# Patient Record
Sex: Male | Born: 1975 | Race: White | Hispanic: No | Marital: Married | Smoking: Current every day smoker
Health system: Southern US, Community
[De-identification: ages and names within clinical notes are randomized; demographics above are authoritative.]

## PROBLEM LIST (undated history)

## (undated) DIAGNOSIS — B1920 Unspecified viral hepatitis C without hepatic coma: Secondary | ICD-10-CM

## (undated) DIAGNOSIS — I10 Essential (primary) hypertension: Secondary | ICD-10-CM

## (undated) DIAGNOSIS — K922 Gastrointestinal hemorrhage, unspecified: Secondary | ICD-10-CM

## (undated) DIAGNOSIS — Z8719 Personal history of other diseases of the digestive system: Secondary | ICD-10-CM

## (undated) DIAGNOSIS — Z87442 Personal history of urinary calculi: Secondary | ICD-10-CM

## (undated) HISTORY — PX: TOOTH EXTRACTION: SUR596

## (undated) HISTORY — DX: Gastrointestinal hemorrhage, unspecified: K92.2

---

## 2006-11-13 HISTORY — PX: HAND SURGERY: SHX662

## 2006-11-24 ENCOUNTER — Emergency Department: Payer: Self-pay | Admitting: Emergency Medicine

## 2006-11-24 ENCOUNTER — Other Ambulatory Visit: Payer: Self-pay

## 2007-02-08 ENCOUNTER — Emergency Department: Payer: Self-pay | Admitting: Unknown Physician Specialty

## 2007-02-21 ENCOUNTER — Ambulatory Visit (HOSPITAL_BASED_OUTPATIENT_CLINIC_OR_DEPARTMENT_OTHER): Admission: RE | Admit: 2007-02-21 | Discharge: 2007-02-21 | Payer: Self-pay | Admitting: Orthopedic Surgery

## 2007-09-11 ENCOUNTER — Emergency Department (HOSPITAL_COMMUNITY): Admission: EM | Admit: 2007-09-11 | Discharge: 2007-09-11 | Payer: Self-pay | Admitting: Emergency Medicine

## 2007-10-08 ENCOUNTER — Emergency Department: Payer: Self-pay | Admitting: Internal Medicine

## 2007-10-08 ENCOUNTER — Other Ambulatory Visit: Payer: Self-pay

## 2007-10-09 ENCOUNTER — Ambulatory Visit: Payer: Self-pay | Admitting: Gastroenterology

## 2007-10-11 ENCOUNTER — Emergency Department: Payer: Self-pay | Admitting: Emergency Medicine

## 2007-11-05 ENCOUNTER — Ambulatory Visit: Payer: Self-pay | Admitting: Gastroenterology

## 2008-01-14 ENCOUNTER — Emergency Department: Payer: Self-pay | Admitting: Emergency Medicine

## 2008-03-09 ENCOUNTER — Emergency Department: Payer: Self-pay | Admitting: Emergency Medicine

## 2008-03-23 ENCOUNTER — Emergency Department: Payer: Self-pay | Admitting: Emergency Medicine

## 2008-06-09 ENCOUNTER — Emergency Department: Payer: Self-pay | Admitting: Emergency Medicine

## 2008-10-22 ENCOUNTER — Emergency Department: Payer: Self-pay | Admitting: Emergency Medicine

## 2008-11-26 ENCOUNTER — Emergency Department: Payer: Self-pay | Admitting: Emergency Medicine

## 2008-12-01 IMAGING — CR DG HAND COMPLETE 3+V*L*
1 series · 3 of 3 positions shown · non-contrast
Comparison: none

REASON FOR EXAM: injury
COMMENTS:   LMP: (Male)

[Series 1: view not recorded · 0.17mm/px · 3 of 3 slices shown]
[im 1/3]
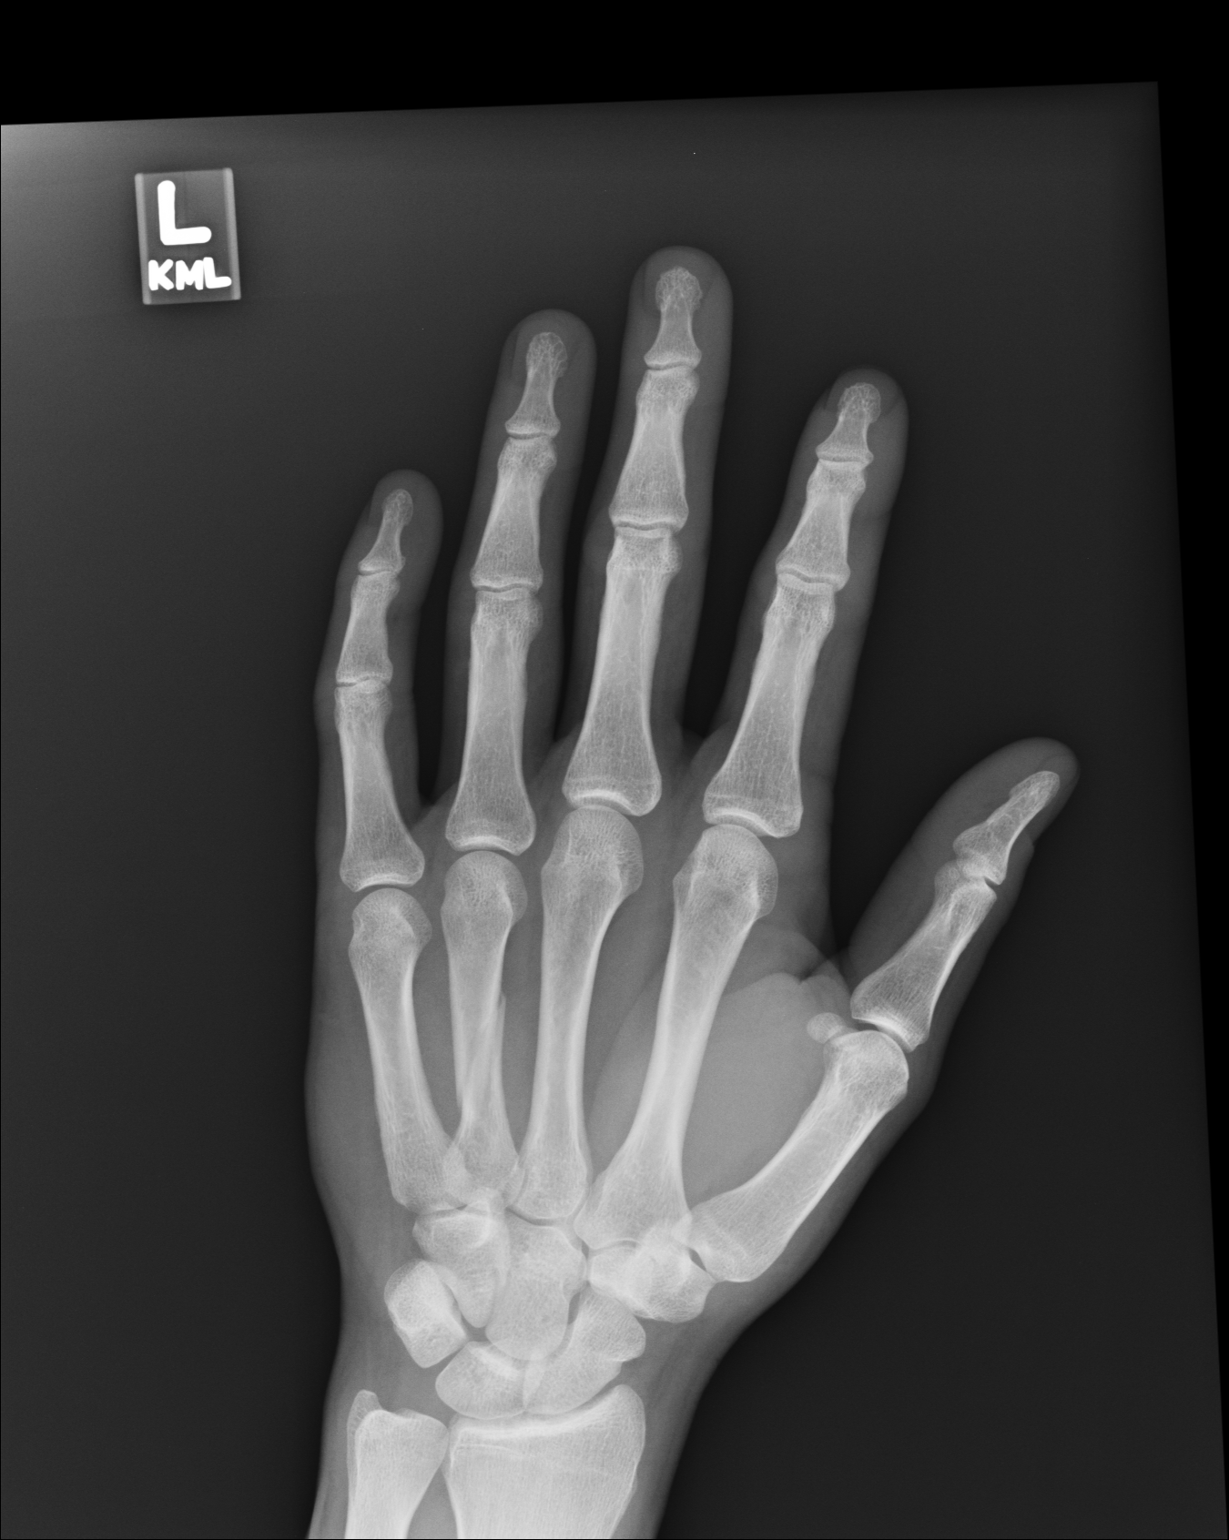
[im 2/3]
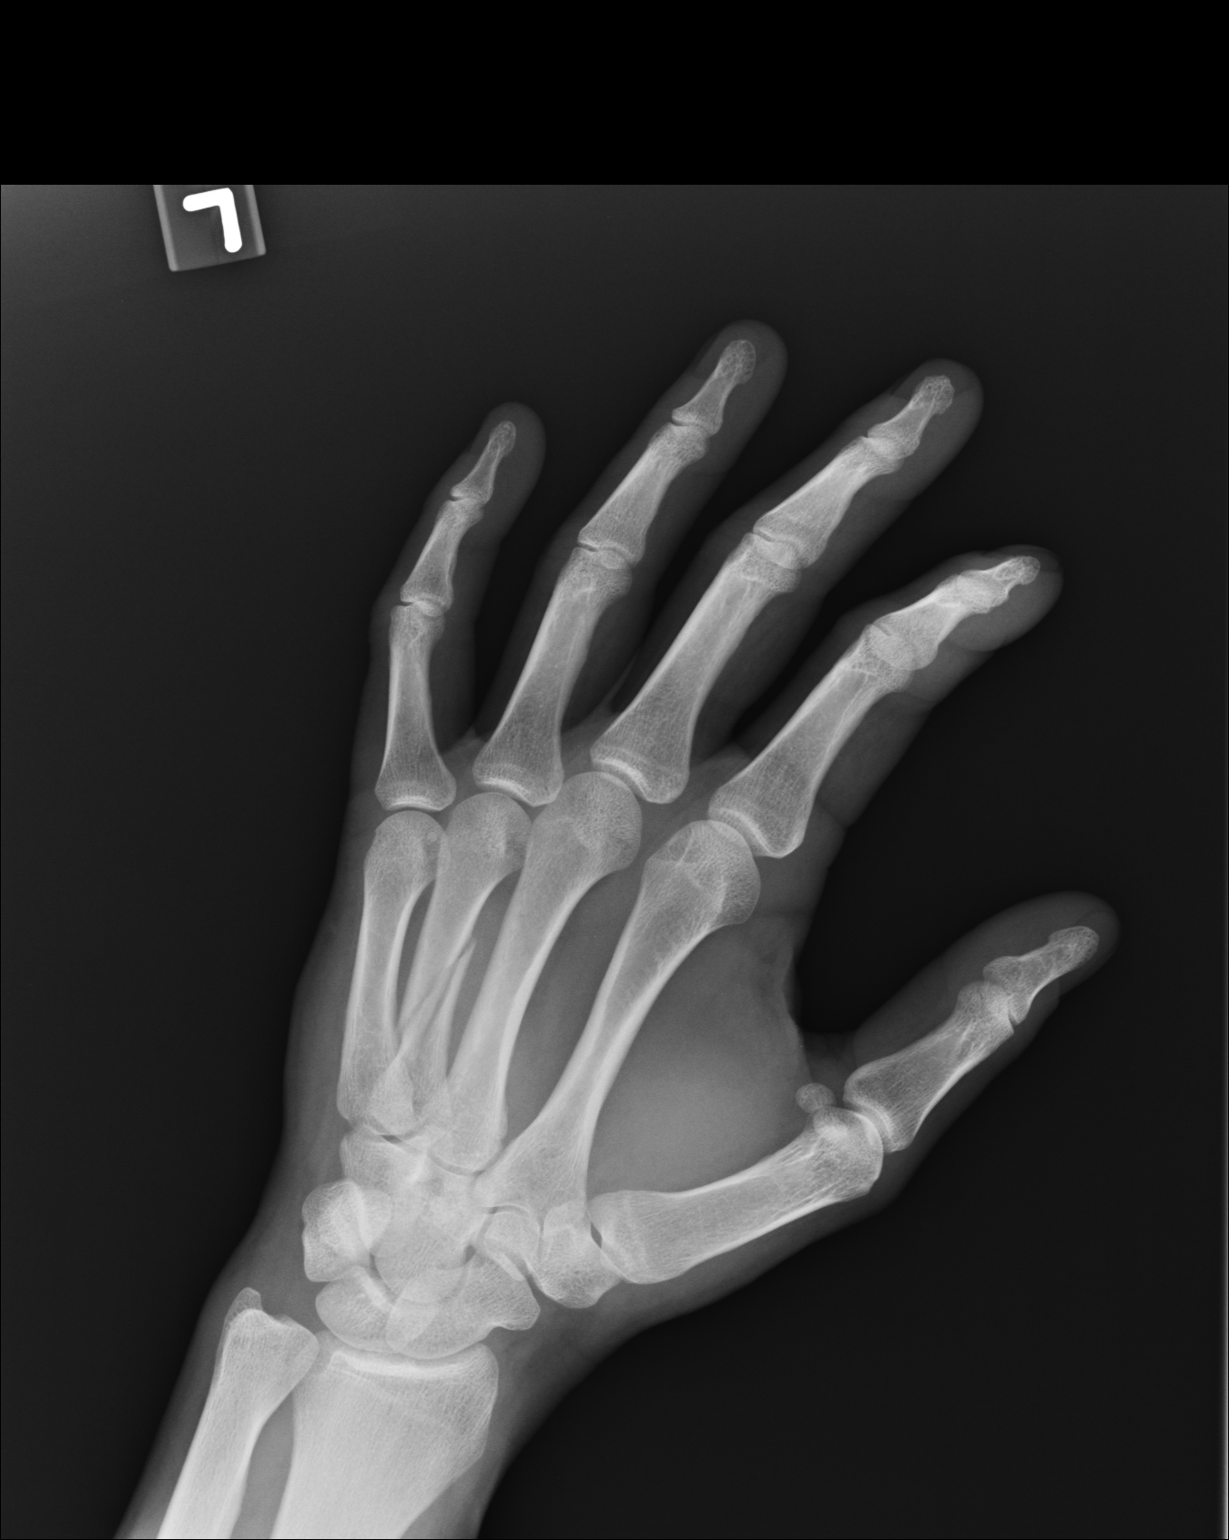
[im 3/3]
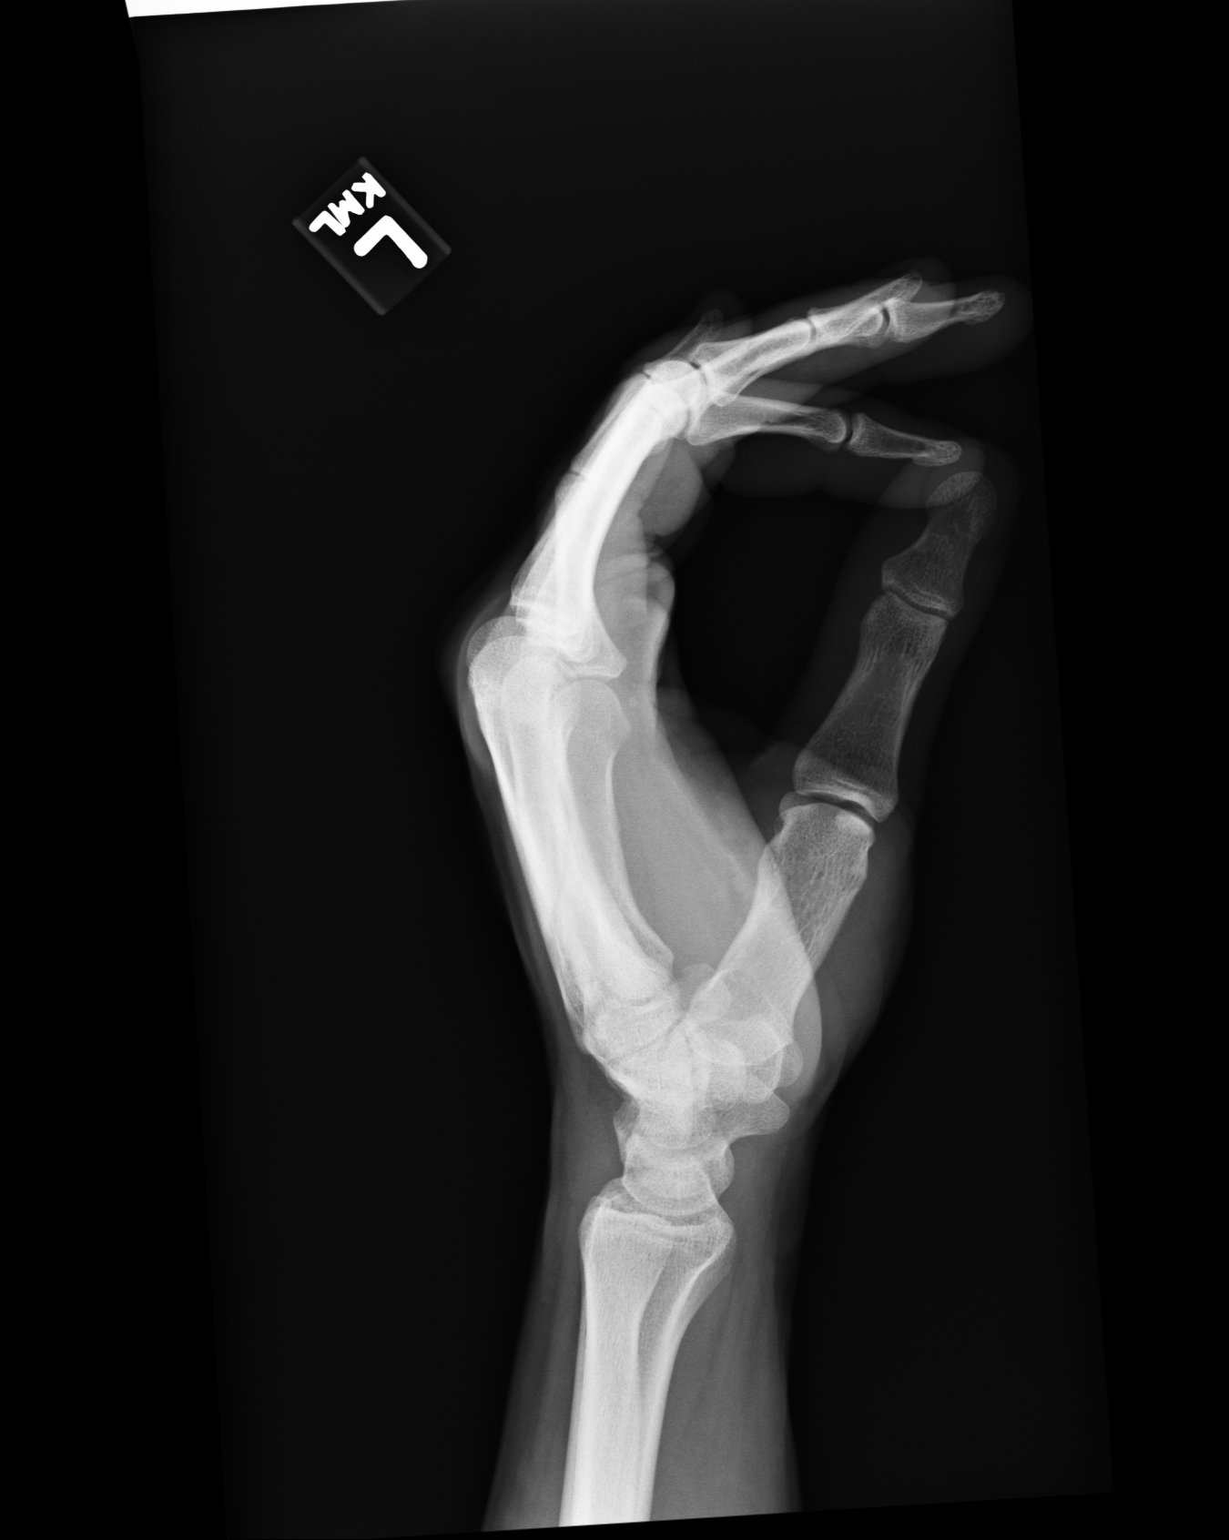

[3 of 3 positions shown; findings below may reference images not displayed]

PROCEDURE:     DXR - DXR HAND LT COMPLETE  W/OBLIQUES  - February 08, 2007 [DATE]

RESULT:     Three views were obtained. There is a minimally displaced
oblique fracture of the proximal diaphysis of the fourth metacarpal. There
is approximately a 2 mm step-off along the lateral cortical margin at the
fracture site distally. No other fractures are seen.
IMPRESSION: 1.     Fracture of the LEFT fourth metacarpal, minimally displaced.

## 2009-03-21 ENCOUNTER — Emergency Department: Payer: Self-pay | Admitting: Unknown Physician Specialty

## 2009-07-17 ENCOUNTER — Emergency Department: Payer: Self-pay | Admitting: Internal Medicine

## 2009-07-21 ENCOUNTER — Emergency Department: Payer: Self-pay | Admitting: Unknown Physician Specialty

## 2009-12-15 ENCOUNTER — Emergency Department: Payer: Self-pay | Admitting: Emergency Medicine

## 2009-12-16 ENCOUNTER — Emergency Department: Payer: Self-pay | Admitting: Emergency Medicine

## 2010-01-29 ENCOUNTER — Emergency Department: Payer: Self-pay | Admitting: Internal Medicine

## 2010-10-15 ENCOUNTER — Emergency Department: Payer: Self-pay | Admitting: Internal Medicine

## 2010-11-25 ENCOUNTER — Emergency Department: Payer: Self-pay | Admitting: Emergency Medicine

## 2011-01-21 ENCOUNTER — Emergency Department: Payer: Self-pay | Admitting: Internal Medicine

## 2011-01-26 ENCOUNTER — Emergency Department: Payer: Self-pay | Admitting: Emergency Medicine

## 2011-02-28 ENCOUNTER — Emergency Department: Payer: Self-pay | Admitting: Emergency Medicine

## 2011-03-12 ENCOUNTER — Emergency Department: Payer: Self-pay | Admitting: Emergency Medicine

## 2011-03-31 NOTE — Op Note (Signed)
NAMEDEKLYN, GIBBON              ACCOUNT NO.:  [id number]   MEDICAL RECORD NO.:  [id number]          PATIENT TYPE:  AMB   LOCATION:  DSC                          FACILITY:  [facility]   PHYSICIAN:  Katy Fitch. Sypher, M.D. DATE OF BIRTH:  [birth year]   DATE OF PROCEDURE:  02/21/2007  DATE OF DISCHARGE:                               OPERATIVE REPORT   PREOPERATIVE DIAGNOSIS:  Displaced spiral oblique fracture of the left  ring finger metacarpal diaphysis with chronic pain and instability and  impending malunion.   POSTOPERATIVE DIAGNOSIS:  Displaced spiral oblique fracture of the left  ring finger metacarpal diaphysis with chronic pain and instability and  impending malunion.   OPERATIONS:  Open reduction internal fixation of left ring finger  metacarpal shaft fracture utilizing hybrid fixation with a five hole  ASIF steel plate and a single lag screw.   OPERATING SURGEON:  Josephine Igo, M.D.   ASSISTANT:  Molly Maduro Dasnoit PA-C.   ANESTHESIA:  General by LMA.   SUPERVISING ANESTHESIOLOGIST:  Dr. Gypsy Balsam.   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  He sustained a  displaced spiral oblique fracture of his left ring finger metacarpal and  was initially seen for evaluation and management at the [facility].  His fracture was noted to be a spiral oblique.  As it was ring finger  metacarpal fracture, they initiated treatment with a short arm cast and  outrigger splint.   Mr. Vincent had persistent pain and was worried about shortening of his  metacarpal and malrotation; therefore, 13 days post injury he sought a  hand surgery consult.   We presented a detailed discussion of the pros and cons of closed  treatment versus open reduction internal fixation.   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  He decided he would prefer to  undergo  open reduction internal fixation with a goal of obtaining an anatomic  reduction, maintaining anatomic reduction and allowing early range of  motion excises so he would not develop stiffness of the metacarpal  phalangeal joints.   After informed consent he is brought to the operating room at this time.   PROCEDURE:  Ewen Varnell is brought to the operating room and placed  supine position on the operating table.   Following the induction of general anesthesia by LMA technique, the left  arm was prepped with Betadine soap solution, sterilely draped.  1 gram  of Ancef was administered as IV prophylactic antibiotic.   The procedure commenced with a 4-cm incision directly over the dorsal  aspect of the left ring finger metacarpal.  Subcutaneous tissue  carefully divided taking care to identify and gently retract the dorsal  ulnar sensory branches.   The fracture site was easily palpable.  The periosteum was thickened and  healing underway in a malunited position with the distal fragment  overriding the proximal fragment by about 6 mm.   All of the callus was taken  down, the periosteum released and the  fracture anatomically reduced.   Due to the fact that this is a relatively short oblique fracture, we  elected to perform hybrid fixation with a five hole plate placed to trap  the distal fragment beneath and a single lag screw was placed at the  proximal apex of the distal fragment.   The central screws in the plate were placed with lag technique.  The  proximal distal screws were placed with neutralization technique and the  lag screw was overdrilled to create excellent compression at the  proximal extent of the distal fragment.   An anatomic reduction was obtained and maintained with the construct.   AP lateral and multiple oblique images were obtained documenting that  the plate and screw construct was appropriately sized and in good  position.   The wound was then  irrigated followed by repair of the periosteum with a  running suture of 4-0 Vicryl.  The skin was repaired with intradermal 3-  0 Prolene and Steri-Strips.   Mr. Canady was placed in a compressive hand dressing in a safe  position.   There were no apparent complications.   He will be discharged prescriptions for Dilaudid 2 mg one to two tablets  by mouth every 4  for 6 hours as needed for pain 20 tablets without  refill.  Also Motrin 6 mg one by mouth every six hours as needed for  pain,  30 tablets without refill and Keflex 500 mg one p.o. every eight  hours x4 days as a prophylactic antibiotic.      Katy Fitch Sypher, M.D.  Electronically Signed     RVS/MEDQ  D:  02/21/2007  T:  02/22/2007  Job:  [id number]

## 2011-08-23 LAB — URINALYSIS, ROUTINE W REFLEX MICROSCOPIC
Bilirubin Urine: NEGATIVE
Glucose, UA: NEGATIVE
Hgb urine dipstick: NEGATIVE
Ketones, ur: NEGATIVE
Nitrite: NEGATIVE
Protein, ur: NEGATIVE
Specific Gravity, Urine: 1.026
Urobilinogen, UA: 1
pH: 6.5

## 2012-02-16 ENCOUNTER — Emergency Department: Payer: Self-pay | Admitting: *Deleted

## 2012-02-27 ENCOUNTER — Emergency Department: Payer: Self-pay | Admitting: Emergency Medicine

## 2012-05-18 ENCOUNTER — Emergency Department: Payer: Self-pay | Admitting: Internal Medicine

## 2013-02-05 ENCOUNTER — Emergency Department: Payer: Self-pay | Admitting: Emergency Medicine

## 2013-02-05 LAB — URINALYSIS, COMPLETE
Bacteria: NONE SEEN
Bilirubin,UR: NEGATIVE
Ketone: NEGATIVE
Leukocyte Esterase: NEGATIVE
RBC,UR: NONE SEEN /HPF (ref 0–5)
Squamous Epithelial: NONE SEEN
WBC UR: <1 /HPF (ref 0–5)

## 2013-02-05 LAB — COMPREHENSIVE METABOLIC PANEL
Alkaline Phosphatase: 62 U/L (ref 50–136)
Bilirubin,Total: 1.1 mg/dL — ABNORMAL HIGH (ref 0.2–1.0)
Calcium, Total: 8.7 mg/dL (ref 8.5–10.1)
Osmolality: 276 (ref 275–301)
SGPT (ALT): 26 U/L (ref 12–78)

## 2013-02-05 LAB — CBC WITH DIFFERENTIAL/PLATELET
HCT: 46.3 % (ref 40.0–52.0)
HGB: 15.8 g/dL (ref 13.0–18.0)
MCH: 32.1 pg (ref 26.0–34.0)
Monocyte #: 0.5 x10 3/mm (ref 0.2–1.0)
Monocyte %: 8 %
Neutrophil %: 64.9 %
Platelet: 286 10*3/uL (ref 150–440)
RBC: 4.93 10*6/uL (ref 4.40–5.90)
RDW: 12.8 % (ref 11.5–14.5)
WBC: 6 10*3/uL (ref 3.8–10.6)

## 2013-02-06 ENCOUNTER — Emergency Department: Payer: Self-pay | Admitting: Internal Medicine

## 2013-02-06 LAB — COMPREHENSIVE METABOLIC PANEL
Albumin: 4.1 g/dL (ref 3.4–5.0)
BUN: 10 mg/dL (ref 7–18)
Co2: 31 mmol/L (ref 21–32)
Creatinine: 1.35 mg/dL — ABNORMAL HIGH (ref 0.60–1.30)
EGFR (African American): >60
EGFR (Non-African Amer.): >60
Glucose: 67 mg/dL (ref 65–99)
Osmolality: 277 (ref 275–301)
Potassium: 4.4 mmol/L (ref 3.5–5.1)
Sodium: 140 mmol/L (ref 136–145)

## 2013-02-06 LAB — LIPASE, BLOOD: Lipase: 83 U/L (ref 73–393)

## 2013-02-06 LAB — CBC
MCH: 32.4 pg (ref 26.0–34.0)
Platelet: 275 10*3/uL (ref 150–440)
RBC: 4.78 10*6/uL (ref 4.40–5.90)
RDW: 12.7 % (ref 11.5–14.5)

## 2013-02-06 LAB — URINALYSIS, COMPLETE
Bilirubin,UR: NEGATIVE
Leukocyte Esterase: NEGATIVE
Ph: 7 (ref 4.5–8.0)
Specific Gravity: 1.009 (ref 1.003–1.030)
Squamous Epithelial: NONE SEEN

## 2014-04-27 ENCOUNTER — Emergency Department: Payer: Self-pay | Admitting: Emergency Medicine

## 2014-04-27 LAB — COMPREHENSIVE METABOLIC PANEL
ALBUMIN: 4.1 g/dL (ref 3.4–5.0)
ALK PHOS: 89 U/L
AST: 25 U/L (ref 15–37)
Anion Gap: 11 (ref 7–16)
BUN: 10 mg/dL (ref 7–18)
Bilirubin,Total: 1 mg/dL (ref 0.2–1.0)
CHLORIDE: 103 mmol/L (ref 98–107)
CO2: 24 mmol/L (ref 21–32)
Calcium, Total: 9.4 mg/dL (ref 8.5–10.1)
Creatinine: 1 mg/dL (ref 0.60–1.30)
EGFR (African American): >60
EGFR (Non-African Amer.): >60
GLUCOSE: 87 mg/dL (ref 65–99)
Osmolality: 274 (ref 275–301)
POTASSIUM: 3.8 mmol/L (ref 3.5–5.1)
SGPT (ALT): 30 U/L (ref 12–78)
Sodium: 138 mmol/L (ref 136–145)
Total Protein: 7.7 g/dL (ref 6.4–8.2)

## 2014-04-27 LAB — URINALYSIS, COMPLETE
BLOOD: NEGATIVE
Bacteria: NONE SEEN
Bilirubin,UR: NEGATIVE
Glucose,UR: NEGATIVE mg/dL (ref 0–75)
Ketone: NEGATIVE
Leukocyte Esterase: NEGATIVE
NITRITE: NEGATIVE
PROTEIN: NEGATIVE
Ph: 8 (ref 4.5–8.0)
RBC,UR: <1 /HPF (ref 0–5)
Specific Gravity: 1.014 (ref 1.003–1.030)
Squamous Epithelial: NONE SEEN

## 2014-04-27 LAB — CBC WITH DIFFERENTIAL/PLATELET
BASOS ABS: 0 10*3/uL (ref 0.0–0.1)
Basophil %: 0.5 %
EOS ABS: 0.1 10*3/uL (ref 0.0–0.7)
EOS PCT: 1.8 %
HCT: 49.1 % (ref 40.0–52.0)
HGB: 16.5 g/dL (ref 13.0–18.0)
LYMPHS ABS: 1.8 10*3/uL (ref 1.0–3.6)
Lymphocyte %: 26.6 %
MCH: 31.2 pg (ref 26.0–34.0)
MCHC: 33.7 g/dL (ref 32.0–36.0)
MCV: 93 fL (ref 80–100)
MONO ABS: 0.6 x10 3/mm (ref 0.2–1.0)
Monocyte %: 8.9 %
Neutrophil #: 4.2 10*3/uL (ref 1.4–6.5)
Neutrophil %: 62.2 %
Platelet: 282 10*3/uL (ref 150–440)
RBC: 5.3 10*6/uL (ref 4.40–5.90)
RDW: 14 % (ref 11.5–14.5)
WBC: 6.7 10*3/uL (ref 3.8–10.6)

## 2014-04-27 LAB — LIPASE, BLOOD: Lipase: 112 U/L (ref 73–393)

## 2014-07-16 ENCOUNTER — Emergency Department: Payer: Self-pay | Admitting: Emergency Medicine

## 2014-07-16 LAB — BASIC METABOLIC PANEL
Anion Gap: 6 — ABNORMAL LOW (ref 7–16)
BUN: 10 mg/dL (ref 7–18)
CALCIUM: 8.8 mg/dL (ref 8.5–10.1)
CHLORIDE: 108 mmol/L — AB (ref 98–107)
CO2: 25 mmol/L (ref 21–32)
Creatinine: 0.99 mg/dL (ref 0.60–1.30)
EGFR (African American): >60
GLUCOSE: 132 mg/dL — AB (ref 65–99)
OSMOLALITY: 278 (ref 275–301)
POTASSIUM: 4.4 mmol/L (ref 3.5–5.1)
Sodium: 139 mmol/L (ref 136–145)

## 2014-07-16 LAB — CBC WITH DIFFERENTIAL/PLATELET
BASOS ABS: 0 10*3/uL (ref 0.0–0.1)
Basophil %: 0.4 %
EOS ABS: 0.1 10*3/uL (ref 0.0–0.7)
EOS PCT: 0.7 %
HCT: 47.4 % (ref 40.0–52.0)
HGB: 15.6 g/dL (ref 13.0–18.0)
LYMPHS ABS: 1.7 10*3/uL (ref 1.0–3.6)
LYMPHS PCT: 21.3 %
MCH: 31.1 pg (ref 26.0–34.0)
MCHC: 32.9 g/dL (ref 32.0–36.0)
MCV: 95 fL (ref 80–100)
MONO ABS: 0.5 x10 3/mm (ref 0.2–1.0)
Monocyte %: 6.5 %
NEUTROS PCT: 71.1 %
Neutrophil #: 5.6 10*3/uL (ref 1.4–6.5)
Platelet: 307 10*3/uL (ref 150–440)
RBC: 5.01 10*6/uL (ref 4.40–5.90)
RDW: 12.8 % (ref 11.5–14.5)
WBC: 7.9 10*3/uL (ref 3.8–10.6)

## 2014-07-16 LAB — URINALYSIS, COMPLETE
BILIRUBIN, UR: NEGATIVE
Bacteria: NONE SEEN
Blood: NEGATIVE
GLUCOSE, UR: NEGATIVE mg/dL (ref 0–75)
Ketone: NEGATIVE
Leukocyte Esterase: NEGATIVE
Nitrite: NEGATIVE
Ph: 7 (ref 4.5–8.0)
Protein: NEGATIVE
RBC,UR: <1 /HPF (ref 0–5)
Specific Gravity: 1.013 (ref 1.003–1.030)

## 2014-07-16 LAB — HEPATIC FUNCTION PANEL A (ARMC)
ALBUMIN: 3.7 g/dL (ref 3.4–5.0)
ALK PHOS: 77 U/L
ALT: 36 U/L
BILIRUBIN TOTAL: 0.6 mg/dL (ref 0.2–1.0)
Bilirubin, Direct: 0.1 mg/dL (ref 0.00–0.20)
SGOT(AST): 30 U/L (ref 15–37)
Total Protein: 7.3 g/dL (ref 6.4–8.2)

## 2014-07-16 LAB — TSH: THYROID STIMULATING HORM: 1.38 u[IU]/mL

## 2014-07-30 ENCOUNTER — Emergency Department: Payer: Self-pay | Admitting: Emergency Medicine

## 2014-07-30 DIAGNOSIS — F1990 Other psychoactive substance use, unspecified, uncomplicated: Secondary | ICD-10-CM | POA: Insufficient documentation

## 2014-08-12 DIAGNOSIS — B1920 Unspecified viral hepatitis C without hepatic coma: Secondary | ICD-10-CM | POA: Insufficient documentation

## 2014-09-27 ENCOUNTER — Emergency Department: Payer: Self-pay | Admitting: Emergency Medicine

## 2014-09-27 LAB — URINALYSIS, COMPLETE
BACTERIA: NONE SEEN
BLOOD: NEGATIVE
Bilirubin,UR: NEGATIVE
Glucose,UR: NEGATIVE mg/dL (ref 0–75)
LEUKOCYTE ESTERASE: NEGATIVE
Nitrite: NEGATIVE
PH: 6 (ref 4.5–8.0)
PROTEIN: NEGATIVE
RBC,UR: <1 /HPF (ref 0–5)
SPECIFIC GRAVITY: 1.016 (ref 1.003–1.030)
Squamous Epithelial: NONE SEEN

## 2014-09-27 LAB — COMPREHENSIVE METABOLIC PANEL
ALT: 292 U/L — AB
AST: 114 U/L — AB (ref 15–37)
Albumin: 3.9 g/dL (ref 3.4–5.0)
Alkaline Phosphatase: 94 U/L
Anion Gap: 5 — ABNORMAL LOW (ref 7–16)
BUN: 11 mg/dL (ref 7–18)
Bilirubin,Total: 2.2 mg/dL — ABNORMAL HIGH (ref 0.2–1.0)
CO2: 26 mmol/L (ref 21–32)
CREATININE: 1.14 mg/dL (ref 0.60–1.30)
Calcium, Total: 8.7 mg/dL (ref 8.5–10.1)
Chloride: 105 mmol/L (ref 98–107)
EGFR (African American): >60
EGFR (Non-African Amer.): >60
Glucose: 143 mg/dL — ABNORMAL HIGH (ref 65–99)
Osmolality: 274 (ref 275–301)
POTASSIUM: 3.9 mmol/L (ref 3.5–5.1)
SODIUM: 136 mmol/L (ref 136–145)
Total Protein: 7.8 g/dL (ref 6.4–8.2)

## 2014-09-27 LAB — CBC WITH DIFFERENTIAL/PLATELET
BASOS PCT: 0.4 %
Basophil #: 0 10*3/uL (ref 0.0–0.1)
EOS PCT: 0.4 %
Eosinophil #: 0 10*3/uL (ref 0.0–0.7)
HCT: 48.1 % (ref 40.0–52.0)
HGB: 16.2 g/dL (ref 13.0–18.0)
LYMPHS ABS: 1.1 10*3/uL (ref 1.0–3.6)
Lymphocyte %: 16.1 %
MCH: 31.7 pg (ref 26.0–34.0)
MCHC: 33.8 g/dL (ref 32.0–36.0)
MCV: 94 fL (ref 80–100)
MONO ABS: 0.8 x10 3/mm (ref 0.2–1.0)
Monocyte %: 11.2 %
NEUTROS ABS: 5 10*3/uL (ref 1.4–6.5)
Neutrophil %: 71.9 %
Platelet: 269 10*3/uL (ref 150–440)
RBC: 5.13 10*6/uL (ref 4.40–5.90)
RDW: 13.8 % (ref 11.5–14.5)
WBC: 7 10*3/uL (ref 3.8–10.6)

## 2014-09-27 LAB — LIPASE, BLOOD: Lipase: 110 U/L (ref 73–393)

## 2014-09-28 DIAGNOSIS — K759 Inflammatory liver disease, unspecified: Secondary | ICD-10-CM | POA: Insufficient documentation

## 2014-11-13 ENCOUNTER — Emergency Department: Payer: Self-pay | Admitting: Student

## 2014-11-13 LAB — CBC WITH DIFFERENTIAL/PLATELET
BASOS PCT: 0.4 %
Basophil #: 0 10*3/uL (ref 0.0–0.1)
EOS PCT: 0.1 %
Eosinophil #: 0 10*3/uL (ref 0.0–0.7)
HCT: 46.6 % (ref 40.0–52.0)
HGB: 15.8 g/dL (ref 13.0–18.0)
Lymphocyte #: 1.1 10*3/uL (ref 1.0–3.6)
Lymphocyte %: 9.5 %
MCH: 31.7 pg (ref 26.0–34.0)
MCHC: 33.8 g/dL (ref 32.0–36.0)
MCV: 94 fL (ref 80–100)
MONO ABS: 0.8 x10 3/mm (ref 0.2–1.0)
MONOS PCT: 7.5 %
NEUTROS PCT: 82.5 %
Neutrophil #: 9.4 10*3/uL — ABNORMAL HIGH (ref 1.4–6.5)
Platelet: 285 10*3/uL (ref 150–440)
RBC: 4.97 10*6/uL (ref 4.40–5.90)
RDW: 13.4 % (ref 11.5–14.5)
WBC: 11.3 10*3/uL — ABNORMAL HIGH (ref 3.8–10.6)

## 2014-11-13 LAB — COMPREHENSIVE METABOLIC PANEL
ALBUMIN: 4 g/dL (ref 3.4–5.0)
ANION GAP: 7 (ref 7–16)
AST: 80 U/L — AB (ref 15–37)
Alkaline Phosphatase: 84 U/L
BUN: 5 mg/dL — ABNORMAL LOW (ref 7–18)
Bilirubin,Total: 1.3 mg/dL — ABNORMAL HIGH (ref 0.2–1.0)
CALCIUM: 8.9 mg/dL (ref 8.5–10.1)
CO2: 26 mmol/L (ref 21–32)
Chloride: 104 mmol/L (ref 98–107)
Creatinine: 1.05 mg/dL (ref 0.60–1.30)
GLUCOSE: 104 mg/dL — AB (ref 65–99)
Osmolality: 271 (ref 275–301)
POTASSIUM: 3.6 mmol/L (ref 3.5–5.1)
SGPT (ALT): 226 U/L — ABNORMAL HIGH
Sodium: 137 mmol/L (ref 136–145)
Total Protein: 7.9 g/dL (ref 6.4–8.2)

## 2014-11-13 LAB — URINALYSIS, COMPLETE
Bacteria: NONE SEEN
Bilirubin,UR: NEGATIVE
Blood: NEGATIVE
Glucose,UR: NEGATIVE mg/dL (ref 0–75)
KETONE: NEGATIVE
Leukocyte Esterase: NEGATIVE
NITRITE: NEGATIVE
PROTEIN: NEGATIVE
Ph: 8 (ref 4.5–8.0)
Specific Gravity: 1.009 (ref 1.003–1.030)
Squamous Epithelial: <1

## 2014-11-13 LAB — DRUG SCREEN, URINE
Amphetamines, Ur Screen: NEGATIVE (ref ?–1000)
BENZODIAZEPINE, UR SCRN: NEGATIVE (ref ?–200)
Barbiturates, Ur Screen: NEGATIVE (ref ?–200)
CANNABINOID 50 NG, UR ~~LOC~~: NEGATIVE (ref ?–50)
Cocaine Metabolite,Ur ~~LOC~~: POSITIVE (ref ?–300)
MDMA (ECSTASY) UR SCREEN: NEGATIVE (ref ?–500)
Methadone, Ur Screen: NEGATIVE (ref ?–300)
Opiate, Ur Screen: NEGATIVE (ref ?–300)
PHENCYCLIDINE (PCP) UR S: NEGATIVE (ref ?–25)
Tricyclic, Ur Screen: NEGATIVE (ref ?–1000)

## 2014-11-13 LAB — ETHANOL: ETHANOL LVL: 5 mg/dL

## 2014-11-13 LAB — SALICYLATE LEVEL: SALICYLATES, SERUM: 2.5 mg/dL

## 2014-11-13 LAB — TROPONIN I: Troponin-I: <0.02 ng/mL

## 2014-11-13 LAB — TSH: THYROID STIMULATING HORM: 2.28 u[IU]/mL

## 2014-11-13 LAB — ACETAMINOPHEN LEVEL: Acetaminophen: <2 ug/mL

## 2014-11-13 LAB — LIPASE, BLOOD: LIPASE: 86 U/L (ref 73–393)

## 2014-11-13 LAB — CK-MB: CK-MB: 0.8 ng/mL (ref 0.5–3.6)

## 2014-11-13 LAB — CK: CK, TOTAL: 98 U/L (ref 39–308)

## 2014-11-16 DIAGNOSIS — F1010 Alcohol abuse, uncomplicated: Secondary | ICD-10-CM | POA: Insufficient documentation

## 2015-07-06 ENCOUNTER — Encounter: Payer: Self-pay | Admitting: Emergency Medicine

## 2015-07-06 ENCOUNTER — Emergency Department
Admission: EM | Admit: 2015-07-06 | Discharge: 2015-07-06 | Disposition: A | Discharge status: Home / Self Care | Payer: Medicaid Other | Attending: Emergency Medicine | Admitting: Emergency Medicine

## 2015-07-06 DIAGNOSIS — Z720 Tobacco use: Secondary | ICD-10-CM | POA: Insufficient documentation

## 2015-07-06 DIAGNOSIS — R109 Unspecified abdominal pain: Secondary | ICD-10-CM | POA: Diagnosis present

## 2015-07-06 DIAGNOSIS — N390 Urinary tract infection, site not specified: Secondary | ICD-10-CM | POA: Insufficient documentation

## 2015-07-06 HISTORY — DX: Unspecified viral hepatitis C without hepatic coma: B19.20

## 2015-07-06 LAB — LIPASE, BLOOD: LIPASE: 22 U/L (ref 22–51)

## 2015-07-06 LAB — COMPREHENSIVE METABOLIC PANEL
ALT: 29 U/L (ref 17–63)
ANION GAP: 7 (ref 5–15)
AST: 34 U/L (ref 15–41)
Albumin: 4.5 g/dL (ref 3.5–5.0)
Alkaline Phosphatase: 61 U/L (ref 38–126)
BUN: 16 mg/dL (ref 6–20)
CHLORIDE: 103 mmol/L (ref 101–111)
CO2: 29 mmol/L (ref 22–32)
Calcium: 9.6 mg/dL (ref 8.9–10.3)
Creatinine, Ser: 1.03 mg/dL (ref 0.61–1.24)
Glucose, Bld: 117 mg/dL — ABNORMAL HIGH (ref 65–99)
POTASSIUM: 4.4 mmol/L (ref 3.5–5.1)
Sodium: 139 mmol/L (ref 135–145)
Total Bilirubin: 1 mg/dL (ref 0.3–1.2)
Total Protein: 8 g/dL (ref 6.5–8.1)

## 2015-07-06 LAB — URINALYSIS COMPLETE WITH MICROSCOPIC (ARMC ONLY)
BACTERIA UA: NONE SEEN
Bilirubin Urine: NEGATIVE
Glucose, UA: NEGATIVE mg/dL
HGB URINE DIPSTICK: NEGATIVE
Ketones, ur: NEGATIVE mg/dL
NITRITE: NEGATIVE
PH: 6 (ref 5.0–8.0)
Protein, ur: NEGATIVE mg/dL
SPECIFIC GRAVITY, URINE: 1.016 (ref 1.005–1.030)

## 2015-07-06 LAB — CBC
HEMATOCRIT: 48.6 % (ref 40.0–52.0)
HEMOGLOBIN: 16.4 g/dL (ref 13.0–18.0)
MCH: 32.1 pg (ref 26.0–34.0)
MCHC: 33.8 g/dL (ref 32.0–36.0)
MCV: 94.9 fL (ref 80.0–100.0)
Platelets: 318 10*3/uL (ref 150–440)
RBC: 5.13 MIL/uL (ref 4.40–5.90)
RDW: 13.1 % (ref 11.5–14.5)
WBC: 6.9 10*3/uL (ref 3.8–10.6)

## 2015-07-06 MED ORDER — SULFAMETHOXAZOLE-TRIMETHOPRIM 800-160 MG PO TABS: 1.0000 | ORAL_TABLET | Freq: Two times a day (BID) | ORAL | Status: DC

## 2015-07-06 NOTE — ED Provider Notes (Signed)
[facility] Emergency Department Provider Note   ____________________________________________  Time seen: 1425  I have reviewed the triage vital signs and the nursing notes.   HISTORY  Chief Complaint Nausea; Diarrhea; and Abdominal Pain   History limited by: Not Limited   HPI Larry Parrish is a 39 y.o. male who presents to the emergency department today because of concerns for abdominal pain, nausea. He states that he has been having these symptoms for the past 3 days. They have been constant. He has not noticed any change with eating. He states that this pain reminds him of the pain he has had when he has had hepatitis flares. He also states he has had some pain with urination. He describes subjective fevers. He denies any chest pain or shortness breath.   Past Medical History  Diagnosis Date  . Hepatitis C     There are no active problems to display for this patient.   History reviewed. No pertinent past surgical history.  No current outpatient prescriptions on file.  Allergies Review of patient's allergies indicates no known allergies.  History reviewed. No pertinent family history.  Social History Social History  Substance Use Topics  . Smoking status: Current Every Day Smoker  . Smokeless tobacco: None  . Alcohol Use: Yes    Review of Systems  Constitutional: Negative for fever. Cardiovascular: Negative for chest pain. Respiratory: Negative for shortness of breath. Gastrointestinal: Positive for upper abdominal pain. Positive for nausea Genitourinary: Positive for dysuria. Musculoskeletal: Negative for back pain. Skin: Negative for rash. Neurological: Negative for headaches, focal weakness or numbness.  10-point ROS otherwise negative.  ____________________________________________   PHYSICAL EXAM:  VITAL SIGNS: ED Triage Vitals  Enc Vitals Group     BP 07/06/15 1220 129/77 mmHg     Pulse Rate 07/06/15 1220 94      Resp 07/06/15 1220 20     Temp 07/06/15 1220 98.2 F (36.8 C)     Temp Source 07/06/15 1220 Oral     SpO2 07/06/15 1220 100 %     Weight 07/06/15 1220 200 lb (90.719 kg)     Height 07/06/15 1220 6\' 2"  (1.88 m)     Head Cir --      Peak Flow --      Pain Score 07/06/15 1221 10   Constitutional: Alert and oriented. Well appearing and in no distress. Eyes: Conjunctivae are normal. PERRL. Normal extraocular movements. ENT   Head: Normocephalic and atraumatic.   Nose: No congestion/rhinnorhea.   Mouth/Throat: Mucous membranes are moist.   Neck: No stridor. Hematological/Lymphatic/Immunilogical: No cervical lymphadenopathy. Cardiovascular: Normal rate, regular rhythm.  No murmurs, rubs, or gallops. Respiratory: Normal respiratory effort without tachypnea nor retractions. Breath sounds are clear and equal bilaterally. No wheezes/rales/rhonchi. Gastrointestinal: Soft. Minimally tender in the epigastric region. No rebound. No guarding.  Genitourinary: Deferred Musculoskeletal: Normal range of motion in all extremities. No joint effusions.  No lower extremity tenderness nor edema. Neurologic:  Normal speech and language. No gross focal neurologic deficits are appreciated. Speech is normal.  Skin:  Skin is warm, dry and intact. No rash noted. Psychiatric: Mood and affect are normal. Speech and behavior are normal. Patient exhibits appropriate insight and judgment.  ____________________________________________    LABS (pertinent positives/negatives)  Labs Reviewed  COMPREHENSIVE METABOLIC PANEL - Abnormal; Notable for the following:    Glucose, Bld 117 (*)    All other components within normal limits  URINALYSIS COMPLETEWITH MICROSCOPIC ([facility] ONLY) - Abnormal; Notable for  the following:    Color, Urine YELLOW (*)    APPearance CLEAR (*)    Leukocytes, UA 1+ (*)    Squamous Epithelial / LPF 0-5 (*)    All other components within normal limits  LIPASE, BLOOD  CBC      ____________________________________________   EKG  None  ____________________________________________    RADIOLOGY  None  ____________________________________________   PROCEDURES  Procedure(s) performed: None  Critical Care performed: No  ____________________________________________   INITIAL IMPRESSION / ASSESSMENT AND PLAN / ED COURSE  Pertinent labs & imaging results that were available during my care of the patient were reviewed by me and considered in my medical decision making (see chart for details).  Patient presented to the emergency department today because of concerns for abdominal pain and nausea. On exam patient did have some minimal epigastric pain. Patient's urine was concerning for urinary tract infection and patient did state that he had some dysuria. At this point will plan on treating for the UTI. Patient states he has medication at home for nausea.  ____________________________________________   FINAL CLINICAL IMPRESSION(S) / ED DIAGNOSES  Final diagnoses:  UTI (lower urinary tract infection)     Nance Pear, MD 07/06/15 [id number]

## 2015-07-06 NOTE — ED Notes (Signed)
States he developed generalized abd pain with constant nausea about 3 days ago.. States he noticed swelling to both lower ext at that time . No swelling noted at present

## 2015-07-06 NOTE — ED Notes (Signed)
Pt to ed with c/o upper abd pain x 2 days and nausea.  Pt also reports one episode of diarrhea last night.  Hx of hepatitis.

## 2015-07-06 NOTE — Discharge Instructions (Signed)
Please seek medical attention for any high fevers, chest pain, shortness of breath, change in behavior, persistent vomiting, bloody stool or any other new or concerning symptoms. ° °Urinary Tract Infection °Urinary tract infections (UTIs) can develop anywhere along your urinary tract. Your urinary tract is your body's drainage system for removing wastes and extra water. Your urinary tract includes two kidneys, two ureters, a bladder, and a urethra. Your kidneys are a pair of bean-shaped organs. Each kidney is about the size of your fist. They are located below your ribs, one on each side of your spine. °CAUSES °Infections are caused by microbes, which are microscopic organisms, including fungi, viruses, and bacteria. These organisms are so small that they can only be seen through a microscope. Bacteria are the microbes that most commonly cause UTIs. °SYMPTOMS  °Symptoms of UTIs may vary by age and gender of the patient and by the location of the infection. Symptoms in young women typically include a frequent and intense urge to urinate and a painful, burning feeling in the bladder or urethra during urination. Older women and men are more likely to be tired, shaky, and weak and have muscle aches and abdominal pain. A fever may mean the infection is in your kidneys. Other symptoms of a kidney infection include pain in your back or sides below the ribs, nausea, and vomiting. °DIAGNOSIS °To diagnose a UTI, your caregiver will ask you about your symptoms. Your caregiver also will ask to provide a urine sample. The urine sample will be tested for bacteria and white blood cells. White blood cells are made by your body to help fight infection. °TREATMENT  °Typically, UTIs can be treated with medication. Because most UTIs are caused by a bacterial infection, they usually can be treated with the use of antibiotics. The choice of antibiotic and length of treatment depend on your symptoms and the type of bacteria causing your  infection. °HOME CARE INSTRUCTIONS °· If you were prescribed antibiotics, take them exactly as your caregiver instructs you. Finish the medication even if you feel better after you have only taken some of the medication. °· Drink enough water and fluids to keep your urine clear or pale yellow. °· Avoid caffeine, tea, and carbonated beverages. They tend to irritate your bladder. °· Empty your bladder often. Avoid holding urine for long periods of time. °· Empty your bladder before and after sexual intercourse. °· After a bowel movement, women should cleanse from front to back. Use each tissue only once. °SEEK MEDICAL CARE IF:  °· You have back pain. °· You develop a fever. °· Your symptoms do not begin to resolve within 3 days. °SEEK IMMEDIATE MEDICAL CARE IF:  °· You have severe back pain or lower abdominal pain. °· You develop chills. °· You have nausea or vomiting. °· You have continued burning or discomfort with urination. °MAKE SURE YOU:  °· Understand these instructions. °· Will watch your condition. °· Will get help right away if you are not doing well or get worse. °Document Released: 08/09/2005 Document Revised: 04/30/2012 Document Reviewed: 12/08/2011 °[employer]® Patient Information ©2015 [employer]. This information is not intended to replace advice given to you by your health care provider. Make sure you discuss any questions you have with your health care provider. ° °

## 2015-07-08 LAB — URINE CULTURE: Culture: NO GROWTH

## 2015-10-03 ENCOUNTER — Emergency Department
Admission: EM | Admit: 2015-10-03 | Discharge: 2015-10-03 | Disposition: A | Discharge status: Home / Self Care | Payer: Medicaid Other | Attending: Emergency Medicine | Admitting: Emergency Medicine

## 2015-10-03 ENCOUNTER — Emergency Department: Payer: Medicaid Other

## 2015-10-03 ENCOUNTER — Encounter: Payer: Self-pay | Admitting: *Deleted

## 2015-10-03 DIAGNOSIS — R1013 Epigastric pain: Secondary | ICD-10-CM | POA: Diagnosis not present

## 2015-10-03 DIAGNOSIS — Z79899 Other long term (current) drug therapy: Secondary | ICD-10-CM | POA: Diagnosis not present

## 2015-10-03 DIAGNOSIS — R1011 Right upper quadrant pain: Secondary | ICD-10-CM | POA: Insufficient documentation

## 2015-10-03 DIAGNOSIS — Z792 Long term (current) use of antibiotics: Secondary | ICD-10-CM | POA: Insufficient documentation

## 2015-10-03 DIAGNOSIS — R1010 Upper abdominal pain, unspecified: Secondary | ICD-10-CM

## 2015-10-03 DIAGNOSIS — R6883 Chills (without fever): Secondary | ICD-10-CM | POA: Diagnosis not present

## 2015-10-03 DIAGNOSIS — F17210 Nicotine dependence, cigarettes, uncomplicated: Secondary | ICD-10-CM | POA: Insufficient documentation

## 2015-10-03 LAB — URINALYSIS COMPLETE WITH MICROSCOPIC (ARMC ONLY)
BACTERIA UA: NONE SEEN
Bilirubin Urine: NEGATIVE
Glucose, UA: NEGATIVE mg/dL
HGB URINE DIPSTICK: NEGATIVE
Ketones, ur: NEGATIVE mg/dL
LEUKOCYTES UA: NEGATIVE
Nitrite: NEGATIVE
PH: 5 (ref 5.0–8.0)
PROTEIN: NEGATIVE mg/dL
RBC / HPF: NONE SEEN RBC/hpf (ref 0–5)
Specific Gravity, Urine: 1.005 (ref 1.005–1.030)

## 2015-10-03 LAB — CBC
HEMATOCRIT: 45.7 % (ref 40.0–52.0)
Hemoglobin: 15.3 g/dL (ref 13.0–18.0)
MCH: 31.4 pg (ref 26.0–34.0)
MCHC: 33.5 g/dL (ref 32.0–36.0)
MCV: 93.8 fL (ref 80.0–100.0)
Platelets: 243 10*3/uL (ref 150–440)
RBC: 4.87 MIL/uL (ref 4.40–5.90)
RDW: 12.5 % (ref 11.5–14.5)
WBC: 8.3 10*3/uL (ref 3.8–10.6)

## 2015-10-03 LAB — COMPREHENSIVE METABOLIC PANEL
ALT: 54 U/L (ref 17–63)
AST: 35 U/L (ref 15–41)
Albumin: 4.1 g/dL (ref 3.5–5.0)
Alkaline Phosphatase: 51 U/L (ref 38–126)
Anion gap: 7 (ref 5–15)
BUN: 16 mg/dL (ref 6–20)
CHLORIDE: 107 mmol/L (ref 101–111)
CO2: 23 mmol/L (ref 22–32)
Calcium: 9.5 mg/dL (ref 8.9–10.3)
Creatinine, Ser: 0.92 mg/dL (ref 0.61–1.24)
Glucose, Bld: 104 mg/dL — ABNORMAL HIGH (ref 65–99)
POTASSIUM: 4.1 mmol/L (ref 3.5–5.1)
SODIUM: 137 mmol/L (ref 135–145)
Total Bilirubin: 0.8 mg/dL (ref 0.3–1.2)
Total Protein: 7.4 g/dL (ref 6.5–8.1)

## 2015-10-03 LAB — LIPASE, BLOOD: LIPASE: 30 U/L (ref 11–51)

## 2015-10-03 LAB — TROPONIN I

## 2015-10-03 MED ORDER — SODIUM CHLORIDE 0.9 % IV BOLUS (SEPSIS)
1000.0000 mL | Freq: Once | INTRAVENOUS | Status: AC
  Administered 2015-10-03: 1000 mL via INTRAVENOUS

## 2015-10-03 MED ORDER — OXYCODONE-ACETAMINOPHEN 5-325 MG PO TABS: 1.0000 | ORAL_TABLET | Freq: Four times a day (QID) | ORAL | Status: DC | PRN

## 2015-10-03 MED ORDER — MORPHINE SULFATE (PF) 4 MG/ML IV SOLN
4.0000 mg | Freq: Once | INTRAVENOUS | Status: AC
  Administered 2015-10-03: 4 mg via INTRAVENOUS
  Filled 2015-10-03: qty 1

## 2015-10-03 MED ORDER — GI COCKTAIL ~~LOC~~
30.0000 mL | Freq: Once | ORAL | Status: AC
  Administered 2015-10-03: 30 mL via ORAL
  Filled 2015-10-03: qty 30

## 2015-10-03 MED ORDER — ONDANSETRON HCL 4 MG/2ML IJ SOLN
4.0000 mg | Freq: Once | INTRAMUSCULAR | Status: AC
  Administered 2015-10-03: 4 mg via INTRAVENOUS
  Filled 2015-10-03: qty 2

## 2015-10-03 NOTE — ED Provider Notes (Addendum)
[facility] Emergency Department Provider Note  Time seen: 6:02 PM  I have reviewed the triage vital signs and the nursing notes.   HISTORY  Chief Complaint Abdominal Pain    HPI ANDY BIBEAULT is a 39 y.o. male with a past medical history of hepatitis who presents the emergency department right upper quadrant pain 4 days. According to the patient for the past 4 days he has experienced dull/aching right upper quadrant pain. He states the pain is much worse shortly after eating. States a history of similar pains in the past which he has been told is due to gastritis. He states he has been taking his Prilosec without relief. Denies any black or bloody stool, nausea, vomiting, diarrhea or constipation. States he takes laxatives at baseline. Denies fever but states he has had several episodes of chills over the past few days. Describes his pain currently as moderate to severe located in the right upper quadrant.     Past Medical History  Diagnosis Date  . Hepatitis C     There are no active problems to display for this patient.   History reviewed. No pertinent past surgical history.  Current Outpatient Rx  Name  Route  Sig  Dispense  Refill  . lansoprazole (PREVACID) 30 MG capsule   Oral   Take 30 mg by mouth daily at 12 noon.         . sulfamethoxazole-trimethoprim (BACTRIM DS,SEPTRA DS) 800-160 MG per tablet   Oral   Take 1 tablet by mouth 2 (two) times daily.   14 tablet   0     Allergies Review of patient's allergies indicates no known allergies.  History reviewed. No pertinent family history.  Social History Social History  Substance Use Topics  . Smoking status: Current Every Day Smoker -- 0.50 packs/day    Types: Cigarettes  . Smokeless tobacco: None  . Alcohol Use: Yes    Review of Systems Constitutional: Negative for fever. Cardiovascular: Negative for chest pain. Respiratory: Negative for shortness of  breath. Gastrointestinal: Positive for right upper quadrant abdominal pain. Worse after eating. Genitourinary: Negative for dysuria. Negative for hematuria. Neurological: Negative for headache 10-point ROS otherwise negative.  ____________________________________________   PHYSICAL EXAM:  VITAL SIGNS: ED Triage Vitals  Enc Vitals Group     BP 10/03/15 1611 144/85 mmHg     Pulse Rate 10/03/15 1611 83     Resp 10/03/15 1611 16     Temp 10/03/15 1611 98.5 F (36.9 C)     Temp Source 10/03/15 1611 Oral     SpO2 10/03/15 1611 97 %     Weight 10/03/15 1611 205 lb (92.987 kg)     Height 10/03/15 1611 6\' 1"  (1.854 m)     Head Cir --      Peak Flow --      Pain Score 10/03/15 1611 10     Pain Loc --      Pain Edu? --      Excl. in [place]? --     Constitutional: Alert and oriented. Well appearing and in no distress. Eyes: Normal exam ENT   Head: Normocephalic and atraumatic.   Mouth/Throat: Mucous membranes are moist. Cardiovascular: Normal rate, regular rhythm. No murmur Respiratory: Normal respiratory effort without tachypnea nor retractions. Breath sounds are clear  Gastrointestinal: Soft, moderate epigastric with mild right upper quadrant tenderness palpation. No rebound or guarding. No CVA tenderness. Musculoskeletal: Nontender with normal range of motion in all extremities.  Neurologic:  Normal speech and language. No gross focal neurologic deficits  Skin:  Skin is warm, dry and intact.  Psychiatric: Mood and affect are normal. Speech and behavior are normal.  ____________________________________________   RADIOLOGY  Ultrasound within normal limits  ____________________________________________    INITIAL IMPRESSION / ASSESSMENT AND PLAN / ED COURSE  Pertinent labs & imaging results that were available during my care of the patient were reviewed by me and considered in my medical decision making (see chart for details).  Patient presents for right upper  quadrant abdominal pain 4 days. We'll check labs, right upper quadrant ultrasound, and treat discomfort. Patient has a history of similar comfort in the past which he has been told is gastritis. We'll also dosage at cocktail.  Ultrasound negative. Labs are largely within normal limits. I discussed with the patient the possibility of gastritis causing his symptoms. We will discharge with a short course of pain medication, patient is to follow-up with GI medicine as well as his primary care doctor this week. Patient agreeable. Discussed my normal abdominal pain return precautions.  ____________________________________________   FINAL CLINICAL IMPRESSION(S) / ED DIAGNOSES  Epigastric pain Right upper quadrant pain   Harvest Dark, MD 10/03/15 2014  Harvest Dark, MD 10/03/15 2018

## 2015-10-03 NOTE — Discharge Instructions (Signed)

## 2015-10-03 NOTE — ED Notes (Addendum)
Pt presents to ED with upper right sided abd pain x 4 days. Pt is Hep C pt that reports having been taking Prevacid and laxatives normally and denies changes in bowel movements. Pt reports eating and pressure makes pain worse. Pt reports increased fatigue and inability to get out of bed. On and off fevers and cold sweats reported. Pt denies NVD. PT does report having a headache 4 days ago and has been increasing Ibuprofen intake over the past 4 days.

## 2015-10-03 NOTE — ED Notes (Signed)

## 2015-10-03 NOTE — ED Notes (Signed)
RUQ pain, intermittent fever. Denies NVD. Pt taking Prilosec and laxatives. Denies urinary sx.

## 2015-10-05 ENCOUNTER — Emergency Department: Payer: Medicaid Other

## 2015-10-05 ENCOUNTER — Encounter: Payer: Self-pay | Admitting: *Deleted

## 2015-10-05 ENCOUNTER — Emergency Department
Admission: EM | Admit: 2015-10-05 | Discharge: 2015-10-05 | Disposition: A | Discharge status: Home / Self Care | Payer: Medicaid Other | Attending: Emergency Medicine | Admitting: Emergency Medicine

## 2015-10-05 DIAGNOSIS — K2971 Gastritis, unspecified, with bleeding: Secondary | ICD-10-CM | POA: Insufficient documentation

## 2015-10-05 DIAGNOSIS — R1013 Epigastric pain: Secondary | ICD-10-CM

## 2015-10-05 DIAGNOSIS — Z792 Long term (current) use of antibiotics: Secondary | ICD-10-CM | POA: Diagnosis not present

## 2015-10-05 DIAGNOSIS — F17210 Nicotine dependence, cigarettes, uncomplicated: Secondary | ICD-10-CM | POA: Insufficient documentation

## 2015-10-05 DIAGNOSIS — R195 Other fecal abnormalities: Secondary | ICD-10-CM

## 2015-10-05 DIAGNOSIS — R000 Tachycardia, unspecified: Secondary | ICD-10-CM | POA: Insufficient documentation

## 2015-10-05 DIAGNOSIS — R509 Fever, unspecified: Secondary | ICD-10-CM | POA: Diagnosis not present

## 2015-10-05 DIAGNOSIS — Z79899 Other long term (current) drug therapy: Secondary | ICD-10-CM | POA: Diagnosis not present

## 2015-10-05 DIAGNOSIS — R1011 Right upper quadrant pain: Secondary | ICD-10-CM | POA: Diagnosis present

## 2015-10-05 DIAGNOSIS — K2970 Gastritis, unspecified, without bleeding: Secondary | ICD-10-CM

## 2015-10-05 HISTORY — DX: Personal history of other diseases of the digestive system: Z87.19

## 2015-10-05 LAB — COMPREHENSIVE METABOLIC PANEL
ALBUMIN: 4.2 g/dL (ref 3.5–5.0)
ALT: 84 U/L — ABNORMAL HIGH (ref 17–63)
ANION GAP: 8 (ref 5–15)
AST: 65 U/L — ABNORMAL HIGH (ref 15–41)
Alkaline Phosphatase: 57 U/L (ref 38–126)
BILIRUBIN TOTAL: 0.7 mg/dL (ref 0.3–1.2)
BUN: 14 mg/dL (ref 6–20)
CO2: 24 mmol/L (ref 22–32)
Calcium: 9.3 mg/dL (ref 8.9–10.3)
Chloride: 102 mmol/L (ref 101–111)
Creatinine, Ser: 1.03 mg/dL (ref 0.61–1.24)
GFR calc Af Amer: >60 mL/min (ref >60)
GFR calc non Af Amer: >60 mL/min (ref >60)
GLUCOSE: 113 mg/dL — AB (ref 65–99)
POTASSIUM: 3.7 mmol/L (ref 3.5–5.1)
SODIUM: 134 mmol/L — AB (ref 135–145)
TOTAL PROTEIN: 7.6 g/dL (ref 6.5–8.1)

## 2015-10-05 LAB — CBC WITH DIFFERENTIAL/PLATELET
BASOS PCT: 1 %
Basophils Absolute: 0 10*3/uL (ref 0–0.1)
EOS ABS: 0 10*3/uL (ref 0–0.7)
Eosinophils Relative: 0 %
HCT: 44.3 % (ref 40.0–52.0)
Hemoglobin: 15.7 g/dL (ref 13.0–18.0)
Lymphocytes Relative: 20 %
Lymphs Abs: 1.2 10*3/uL (ref 1.0–3.6)
MCH: 32.3 pg (ref 26.0–34.0)
MCHC: 35.5 g/dL (ref 32.0–36.0)
MCV: 91.1 fL (ref 80.0–100.0)
MONO ABS: 0.9 10*3/uL (ref 0.2–1.0)
MONOS PCT: 15 %
Neutro Abs: 3.9 10*3/uL (ref 1.4–6.5)
Neutrophils Relative %: 64 %
Platelets: 226 10*3/uL (ref 150–440)
RBC: 4.86 MIL/uL (ref 4.40–5.90)
RDW: 12.4 % (ref 11.5–14.5)
WBC: 6.1 10*3/uL (ref 3.8–10.6)

## 2015-10-05 LAB — PROTIME-INR
INR: 1.01
PROTHROMBIN TIME: 13.5 s (ref 11.4–15.0)

## 2015-10-05 LAB — URINALYSIS COMPLETE WITH MICROSCOPIC (ARMC ONLY)
BILIRUBIN URINE: NEGATIVE
Bacteria, UA: NONE SEEN
GLUCOSE, UA: NEGATIVE mg/dL
Hgb urine dipstick: NEGATIVE
KETONES UR: NEGATIVE mg/dL
LEUKOCYTES UA: NEGATIVE
Nitrite: NEGATIVE
PH: 6 (ref 5.0–8.0)
Protein, ur: NEGATIVE mg/dL
Specific Gravity, Urine: 1.008 (ref 1.005–1.030)

## 2015-10-05 LAB — APTT: aPTT: 35 s (ref 24–36)

## 2015-10-05 LAB — LIPASE, BLOOD: Lipase: 22 U/L (ref 11–51)

## 2015-10-05 MED ORDER — PANTOPRAZOLE SODIUM 40 MG PO TBEC: 40.0000 mg | DELAYED_RELEASE_TABLET | Freq: Two times a day (BID) | ORAL | Status: DC

## 2015-10-05 MED ORDER — ONDANSETRON HCL 4 MG/2ML IJ SOLN
4.0000 mg | INTRAMUSCULAR | Status: AC
  Administered 2015-10-05: 4 mg via INTRAVENOUS
  Filled 2015-10-05: qty 2

## 2015-10-05 MED ORDER — IOHEXOL 350 MG/ML SOLN
100.0000 mL | Freq: Once | INTRAVENOUS | Status: AC | PRN
  Administered 2015-10-05: 100 mL via INTRAVENOUS

## 2015-10-05 MED ORDER — GI COCKTAIL ~~LOC~~
30.0000 mL | Freq: Once | ORAL | Status: AC
  Administered 2015-10-05: 30 mL via ORAL
  Filled 2015-10-05: qty 30

## 2015-10-05 MED ORDER — SODIUM CHLORIDE 0.9 % IV BOLUS (SEPSIS)
1000.0000 mL | INTRAVENOUS | Status: AC
  Administered 2015-10-05: 1000 mL via INTRAVENOUS

## 2015-10-05 MED ORDER — SUCRALFATE 1 GM/10ML PO SUSP: 1.0000 g | Freq: Four times a day (QID) | ORAL | Status: DC

## 2015-10-05 MED ORDER — HYDROMORPHONE HCL 1 MG/ML IJ SOLN
1.0000 mg | INTRAMUSCULAR | Status: AC
  Administered 2015-10-05: 1 mg via INTRAVENOUS
  Filled 2015-10-05: qty 1

## 2015-10-05 MED ORDER — IOHEXOL 240 MG/ML SOLN
25.0000 mL | Freq: Once | INTRAMUSCULAR | Status: AC | PRN
  Administered 2015-10-05: 25 mL via ORAL

## 2015-10-05 NOTE — ED Notes (Signed)
Sent from [facility]. C/o abd pain, stool positive for blood, has Hep c

## 2015-10-05 NOTE — ED Notes (Signed)
Patient transported to CT 

## 2015-10-05 NOTE — ED Provider Notes (Signed)
[facility], [place] Emergency Department Provider Note  ____________________________________________  Time seen: Approximately 7:16 PM  I have reviewed the triage vital signs and the nursing notes.   HISTORY  Chief Complaint Abdominal pain   HPI Larry Parrish is a 39 y.o. male with a history of hepatitis C and drug abuse (cocaine) and regular ETOH usepresents with severe abdominal pain located in the epigastrium and RUQ, accompanied by nausea, one episode of vomiting.  He also reports fever to 102 yesterday which improved after NSAIDs today.  He was seen in the ED two days ago and had an generally reassuring but technically limited RUQ U/S (due to the patient recently eating) and essentially normal labs.  He follow up today as instructed with his PCP, who noted worsening abd pain, +hemoccult stool, and sent him back to the ED.  He describes the pain as severe, persistent and worsening for days, located in the RUQ and epigastrium, worsened by eating and with any movement, alleviated by nothing.  Denies CP/SOB.   Past Medical History  Diagnosis Date  . Hepatitis C   . History of pancreatitis     There are no active problems to display for this patient.   History reviewed. No pertinent past surgical history.  Current Outpatient Rx  Name  Route  Sig  Dispense  Refill  . lansoprazole (PREVACID) 30 MG capsule   Oral   Take 30 mg by mouth daily at 12 noon.         Marland Kitchen oxyCODONE-acetaminophen (ROXICET) 5-325 MG tablet   Oral   Take 1 tablet by mouth every 6 (six) hours as needed.   15 tablet   0   . pantoprazole (PROTONIX) 40 MG tablet   Oral   Take 1 tablet (40 mg total) by mouth 2 (two) times daily.   60 tablet   1   . sucralfate (CARAFATE) 1 GM/10ML suspension   Oral   Take 10 mLs (1 g total) by mouth 4 (four) times daily.   420 mL   1   . sulfamethoxazole-trimethoprim (BACTRIM DS,SEPTRA DS) 800-160 MG per tablet   Oral   Take 1 tablet by mouth 2  (two) times daily.   14 tablet   0     Allergies Review of patient's allergies indicates no known allergies.  History reviewed. No pertinent family history.  Social History Social History  Substance Use Topics  . Smoking status: Current Every Day Smoker -- 0.50 packs/day    Types: Cigarettes  . Smokeless tobacco: None  . Alcohol Use: Yes     Comment: regular (heavy) ETOH consumption    Review of Systems Constitutional: Fever to 102 at home within the last 24 hours Eyes: No visual changes. ENT: No sore throat. Cardiovascular: Denies chest pain. Respiratory: Denies shortness of breath. Gastrointestinal: severe epigastric and RUQ pain w/ N/V.  Blood in stool and reportedly has had black, tarry stools at home Genitourinary: Negative for dysuria. Musculoskeletal: Negative for back pain. Skin: Negative for rash. Neurological: Negative for headaches, focal weakness or numbness.  10-point ROS otherwise negative.  ____________________________________________   PHYSICAL EXAM:  VITAL SIGNS: ED Triage Vitals  Enc Vitals Group     BP 10/05/15 1820 135/94 mmHg     Pulse Rate 10/05/15 1820 101     Resp 10/05/15 1820 24     Temp 10/05/15 1820 98.2 F (36.8 C)     Temp Source 10/05/15 1820 Oral     SpO2 10/05/15 1820  98 %     Weight 10/05/15 1820 197 lb (89.359 kg)     Height 10/05/15 1820 6\' 1"  (1.854 m)     Head Cir --      Peak Flow --      Pain Score 10/05/15 1820 10     Pain Loc --      Pain Edu? --      Excl. in [place]? --     Constitutional: Alert and oriented. Appears to be in severe distress from abd pain Eyes: Conjunctivae are normal. PERRL. EOMI. Head: Atraumatic. Nose: No congestion/rhinnorhea. Mouth/Throat: Mucous membranes are moist.  Oropharynx non-erythematous. Neck: No stridor.   Cardiovascular: Tachycardia, regular rhythm. Grossly normal heart sounds.  Good peripheral circulation. Respiratory: Normal respiratory effort.  No retractions. Lungs  CTAB. Gastrointestinal/Rectal: Severe RUQ and epigastric tenderness.  Firm but not peritoneal.  Normal external rectal exam, no gross blood but strongly heme positive (quality control passed). Musculoskeletal: No lower extremity tenderness nor edema.  No joint effusions. Neurologic:  Normal speech and language. No gross focal neurologic deficits are appreciated.  Skin:  Skin is warm, dry and intact. No rash noted. Psychiatric: Mood and affect are normal. Speech and behavior are normal.  ____________________________________________   LABS (all labs ordered are listed, but only abnormal results are displayed)  Labs Reviewed  COMPREHENSIVE METABOLIC PANEL - Abnormal; Notable for the following:    Sodium 134 (*)    Glucose, Bld 113 (*)    AST 65 (*)    ALT 84 (*)    All other components within normal limits  URINALYSIS COMPLETEWITH MICROSCOPIC ([facility] ONLY) - Abnormal; Notable for the following:    Color, Urine YELLOW (*)    APPearance CLEAR (*)    Squamous Epithelial / LPF 0-5 (*)    All other components within normal limits  CBC WITH DIFFERENTIAL/PLATELET  LIPASE, BLOOD  PROTIME-INR  APTT   ____________________________________________  EKG  Not indicated ____________________________________________  RADIOLOGY   Ct Abdomen Pelvis W Contrast  10/05/2015  CLINICAL DATA:  Right upper quadrant and epigastric abdominal pain. Bloody stools. Hepatitis-C. EXAM: CT ABDOMEN AND PELVIS WITH CONTRAST TECHNIQUE: Multidetector CT imaging of the abdomen and pelvis was performed using the standard protocol following bolus administration of intravenous contrast. CONTRAST:  150mL OMNIPAQUE IOHEXOL 350 MG/ML SOLN COMPARISON:  11/13/2014 FINDINGS: Lower chest: Normal heart size. No pericardial or pleural effusion. Clear lung bases. Hepatobiliary: Liver demonstrates no focal abnormality or biliary dilatation. Patent hepatic and portal veins. Gallbladder is collapsed. No biliary dilatation. Stable  small porta hepatis lymph nodes as before. Pancreas: No mass, inflammatory changes, or other significant abnormality. Spleen: Within normal limits in size and appearance. Adrenals/Urinary Tract: No masses identified. No evidence of hydronephrosis. Stomach/Bowel: Negative for bowel obstruction, significant dilatation, ileus, or free air. Normal appendix. No acute inflammatory process. Vascular/Lymphatic: No adenopathy. Negative for aneurysm. No vascular abnormality. Reproductive: No mass or other significant abnormality. Other: No inguinal abnormality or hernia. Musculoskeletal: No acute osseous finding. Minor degenerative change at L5-S1. IMPRESSION: No acute intra-abdominal or pelvic finding by CT. Normal appendix. No interval change. Electronically Signed   By: Jerilynn Mages.  Shick M.D.   On: 10/05/2015 21:16    ____________________________________________   PROCEDURES  Procedure(s) performed: None  Critical Care performed: No ____________________________________________   INITIAL IMPRESSION / ASSESSMENT AND PLAN / ED COURSE  Pertinent labs & imaging results that were available during my care of the patient were reviewed by me and considered in my medical decision making (see  chart for details).  Suspect pancreatitis - patient admits to drinking alcohol regularly as well as smoking crack.  Must consider gallbladder disease as well, but U/S was reassuring 2 days ago, though technically limited.  Will control pain/nausea and reassess after labs.  ----------------------------------------- 8:11 PM on 10/05/2015 -----------------------------------------  Lipase is normal and LFTs are unremarkable except for a very slight elevation of AST and ALT.  Cholangitis is very unlikely given that he does not have a fever currently and he has no hyperbilirubinemia/jaundice.  However, his ultrasound was borderline 2 days ago even though it was technically limited.  Given the possibility of various pathology in his  abdomen, I will proceed with a CT scan with by mouth and IV contrast for the best possible evaluation of his abdomen.  No anemia and generally reassuring CBC.  ----------------------------------------- 9:42 PM on 10/05/2015 -----------------------------------------  Patient's workup has been extremely reassuring.  I gave him the good results from the CT scan, he stayed in a good mood, smile is somewhat good to know, and started taking off his monitoring equipment.  I stressed to him the importance of following up with Dr. Vira Agar as recommended and as are scheduled.  He has been hemodynamically stable with no evidence of acute abnormality today.  He has no evidence of peptic ulcer disease on CT scan.  I am recommending to him that he take twice a day PPI and sucralfate as needed.  He understands and agrees with the plan.  I also counseled them on alcohol cessation and avoiding fatty and greasy foods.I gave my usual and customary return precautions.    ____________________________________________  FINAL CLINICAL IMPRESSION(S) / ED DIAGNOSES  Final diagnoses:  RUQ pain  Epigastric pain  Occult GI bleeding  Gastritis      NEW MEDICATIONS STARTED DURING THIS VISIT:  New Prescriptions   PANTOPRAZOLE (PROTONIX) 40 MG TABLET    Take 1 tablet (40 mg total) by mouth 2 (two) times daily.   SUCRALFATE (CARAFATE) 1 GM/10ML SUSPENSION    Take 10 mLs (1 g total) by mouth 4 (four) times daily.     Hinda Kehr, MD 10/05/15 2144

## 2015-10-05 NOTE — Discharge Instructions (Signed)
You have been seen in the Emergency Department (ED) for abdominal pain.  Your evaluation did not identify a clear cause of your symptoms but was generally reassuring.  We believe you are likely suffering from pain related to acid reflux or gastritis given your chronic alcohol use.  It is important that you do not drink alcohol, both for the pancreatitis you have had in the past as well as for the current pain you are experiencing.  We recommend that you increase the dose of your antacid medicines twice a day or try the prescribed pantoprazole twice daily.  You can also use the sucralfate prescription for soothing including your stomach.  Please follow up as instructed above regarding todays emergent visit and the symptoms that are bothering you.  Return to the ED if your abdominal pain worsens or fails to improve, you develop bloody vomiting, bloody diarrhea, you are unable to tolerate fluids due to vomiting, fever greater than 101, or other symptoms that concern you.   Abdominal Pain, Adult Many things can cause abdominal pain. Usually, abdominal pain is not caused by a disease and will improve without treatment. It can often be observed and treated at home. Your health care provider will do a physical exam and possibly order blood tests and X-rays to help determine the seriousness of your pain. However, in many cases, more time must pass before a clear cause of the pain can be found. Before that point, your health care provider may not know if you need more testing or further treatment. HOME CARE INSTRUCTIONS Monitor your abdominal pain for any changes. The following actions may help to alleviate any discomfort you are experiencing:  Only take over-the-counter or prescription medicines as directed by your health care provider.  Do not take laxatives unless directed to do so by your health care provider.  Try a clear liquid diet (broth, tea, or water) as directed by your health care provider.  Slowly move to a bland diet as tolerated. SEEK MEDICAL CARE IF:  You have unexplained abdominal pain.  You have abdominal pain associated with nausea or diarrhea.  You have pain when you urinate or have a bowel movement.  You experience abdominal pain that wakes you in the night.  You have abdominal pain that is worsened or improved by eating food.  You have abdominal pain that is worsened with eating fatty foods.  You have a fever. SEEK IMMEDIATE MEDICAL CARE IF:  Your pain does not go away within 2 hours.  You keep throwing up (vomiting).  Your pain is felt only in portions of the abdomen, such as the right side or the left lower portion of the abdomen.  You pass bloody or black tarry stools. MAKE SURE YOU:  Understand these instructions.  Will watch your condition.  Will get help right away if you are not doing well or get worse.   This information is not intended to replace advice given to you by your health care provider. Make sure you discuss any questions you have with your health care provider.   Document Released: 08/09/2005 Document Revised: 07/21/2015 Document Reviewed: 07/09/2013 Elsevier Interactive Patient Education 2016 Elsevier Inc[employer]  Gastritis, Adult Gastritis is soreness and swelling (inflammation) of the lining of the stomach. Gastritis can develop as a sudden onset (acute) or long-term (chronic) condition. If gastritis is not treated, it can lead to stomach bleeding and ulcers. CAUSES  Gastritis occurs when the stomach lining is weak or damaged. Digestive juices from the stomach  then inflame the weakened stomach lining. The stomach lining may be weak or damaged due to viral or bacterial infections. One common bacterial infection is the Helicobacter pylori infection. Gastritis can also result from excessive alcohol consumption, taking certain medicines, or having too much acid in the stomach.  SYMPTOMS  In some cases, there are no symptoms. When symptoms  are present, they may include:  Pain or a burning sensation in the upper abdomen.  Nausea.  Vomiting.  An uncomfortable feeling of fullness after eating. DIAGNOSIS  Your caregiver may suspect you have gastritis based on your symptoms and a physical exam. To determine the cause of your gastritis, your caregiver may perform the following:  Blood or stool tests to check for the H pylori bacterium.  Gastroscopy. A thin, flexible tube (endoscope) is passed down the esophagus and into the stomach. The endoscope has a light and camera on the end. Your caregiver uses the endoscope to view the inside of the stomach.  Taking a tissue sample (biopsy) from the stomach to examine under a microscope. TREATMENT  Depending on the cause of your gastritis, medicines may be prescribed. If you have a bacterial infection, such as an H pylori infection, antibiotics may be given. If your gastritis is caused by too much acid in the stomach, H2 blockers or antacids may be given. Your caregiver may recommend that you stop taking aspirin, ibuprofen, or other nonsteroidal anti-inflammatory drugs (NSAIDs). HOME CARE INSTRUCTIONS  Only take over-the-counter or prescription medicines as directed by your caregiver.  If you were given antibiotic medicines, take them as directed. Finish them even if you start to feel better.  Drink enough fluids to keep your urine clear or pale yellow.  Avoid foods and drinks that make your symptoms worse, such as:  Caffeine or alcoholic drinks.  Chocolate.  Peppermint or mint flavorings.  Garlic and onions.  Spicy foods.  Citrus fruits, such as oranges, lemons, or limes.  Tomato-based foods such as sauce, chili, salsa, and pizza.  Fried and fatty foods.  Eat small, frequent meals instead of large meals. SEEK IMMEDIATE MEDICAL CARE IF:   You have black or dark red stools.  You vomit blood or material that looks like coffee grounds.  You are unable to keep fluids  down.  Your abdominal pain gets worse.  You have a fever.  You do not feel better after 1 week.  You have any other questions or concerns. MAKE SURE YOU:  Understand these instructions.  Will watch your condition.  Will get help right away if you are not doing well or get worse.   This information is not intended to replace advice given to you by your health care provider. Make sure you discuss any questions you have with your health care provider.   Document Released: 10/24/2001 Document Revised: 04/30/2012 Document Reviewed: 12/13/2011 Elsevier Interactive Patient Education [employer].

## 2015-10-08 ENCOUNTER — Encounter: Payer: Self-pay | Admitting: Emergency Medicine

## 2015-10-08 ENCOUNTER — Emergency Department
Admission: EM | Admit: 2015-10-08 | Discharge: 2015-10-08 | Disposition: A | Discharge status: Home / Self Care | Payer: Medicaid Other | Attending: Emergency Medicine | Admitting: Emergency Medicine

## 2015-10-08 DIAGNOSIS — Z79899 Other long term (current) drug therapy: Secondary | ICD-10-CM | POA: Diagnosis not present

## 2015-10-08 DIAGNOSIS — F1910 Other psychoactive substance abuse, uncomplicated: Secondary | ICD-10-CM

## 2015-10-08 DIAGNOSIS — F1410 Cocaine abuse, uncomplicated: Secondary | ICD-10-CM | POA: Insufficient documentation

## 2015-10-08 DIAGNOSIS — B182 Chronic viral hepatitis C: Secondary | ICD-10-CM

## 2015-10-08 DIAGNOSIS — L02414 Cutaneous abscess of left upper limb: Secondary | ICD-10-CM | POA: Diagnosis present

## 2015-10-08 DIAGNOSIS — F17210 Nicotine dependence, cigarettes, uncomplicated: Secondary | ICD-10-CM | POA: Insufficient documentation

## 2015-10-08 HISTORY — PX: I&D EXTREMITY: SHX5045

## 2015-10-08 LAB — CBC WITH DIFFERENTIAL/PLATELET
BASOS ABS: 0.1 10*3/uL (ref 0–0.1)
Basophils Relative: 1 %
EOS PCT: 2 %
Eosinophils Absolute: 0.1 10*3/uL (ref 0–0.7)
HCT: 42.3 % (ref 40.0–52.0)
Hemoglobin: 14.5 g/dL (ref 13.0–18.0)
LYMPHS PCT: 20 %
Lymphs Abs: 1.5 10*3/uL (ref 1.0–3.6)
MCH: 32 pg (ref 26.0–34.0)
MCHC: 34.4 g/dL (ref 32.0–36.0)
MCV: 93.1 fL (ref 80.0–100.0)
MONO ABS: 1 10*3/uL (ref 0.2–1.0)
MONOS PCT: 14 %
Neutro Abs: 4.8 10*3/uL (ref 1.4–6.5)
Neutrophils Relative %: 63 %
PLATELETS: 211 10*3/uL (ref 150–440)
RBC: 4.54 MIL/uL (ref 4.40–5.90)
RDW: 12.3 % (ref 11.5–14.5)
WBC: 7.5 10*3/uL (ref 3.8–10.6)

## 2015-10-08 LAB — COMPREHENSIVE METABOLIC PANEL
ALT: 40 U/L (ref 17–63)
AST: 28 U/L (ref 15–41)
Albumin: 3.9 g/dL (ref 3.5–5.0)
Alkaline Phosphatase: 60 U/L (ref 38–126)
Anion gap: 7 (ref 5–15)
BILIRUBIN TOTAL: 0.5 mg/dL (ref 0.3–1.2)
BUN: 15 mg/dL (ref 6–20)
CHLORIDE: 105 mmol/L (ref 101–111)
CO2: 26 mmol/L (ref 22–32)
CREATININE: 0.99 mg/dL (ref 0.61–1.24)
Calcium: 9 mg/dL (ref 8.9–10.3)
GFR calc Af Amer: >60 mL/min (ref >60)
Glucose, Bld: 98 mg/dL (ref 65–99)
Potassium: 4 mmol/L (ref 3.5–5.1)
Sodium: 138 mmol/L (ref 135–145)
TOTAL PROTEIN: 7.4 g/dL (ref 6.5–8.1)

## 2015-10-08 LAB — LACTIC ACID, PLASMA: LACTIC ACID, VENOUS: 1.1 mmol/L (ref 0.5–2.0)

## 2015-10-08 MED ORDER — LIDOCAINE-EPINEPHRINE (PF) 1 %-1:200000 IJ SOLN
INTRAMUSCULAR | Status: AC
  Administered 2015-10-08: 19:00:00
  Filled 2015-10-08: qty 30

## 2015-10-08 MED ORDER — MORPHINE SULFATE (PF) 4 MG/ML IV SOLN
4.0000 mg | Freq: Once | INTRAVENOUS | Status: AC
  Administered 2015-10-08: 4 mg via INTRAVENOUS
  Filled 2015-10-08: qty 1

## 2015-10-08 MED ORDER — SULFAMETHOXAZOLE-TRIMETHOPRIM 800-160 MG PO TABS: 2.0000 | ORAL_TABLET | Freq: Two times a day (BID) | ORAL | Status: DC

## 2015-10-08 MED ORDER — TRAMADOL HCL 50 MG PO TABS: 50.0000 mg | ORAL_TABLET | Freq: Four times a day (QID) | ORAL | Status: DC | PRN

## 2015-10-08 MED ORDER — VANCOMYCIN HCL IN DEXTROSE 1-5 GM/200ML-% IV SOLN
1000.0000 mg | Freq: Once | INTRAVENOUS | Status: AC
  Administered 2015-10-08: 1000 mg via INTRAVENOUS
  Filled 2015-10-08: qty 200

## 2015-10-08 MED ORDER — CEPHALEXIN 500 MG PO CAPS: 500.0000 mg | ORAL_CAPSULE | Freq: Four times a day (QID) | ORAL | Status: DC

## 2015-10-08 NOTE — ED Provider Notes (Signed)
[facility] Emergency Department Provider Note REMINDER - THIS NOTE IS NOT A FINAL MEDICAL RECORD UNTIL IT IS SIGNED. UNTIL THEN, THE CONTENT BELOW MAY REFLECT INFORMATION FROM A DOCUMENTATION TEMPLATE, NOT THE ACTUAL PATIENT VISIT. ____________________________________________  Time seen: Approximately 5:55 PM  I have reviewed the triage vital signs and the nursing notes.   HISTORY  Chief Complaint Abscess    HPI Larry Parrish is a 39 y.o. male the history of IV drug abuse, also a recent GI bleed which she reports is improved. Also history of hepatitis.  Patient reports the last 3-4 days she's had pain over the arm with increasing redness and swelling associated with chills and feeling feverish. He is currently not on any antibiotics, but reports severe pain and swelling around his left mid arm. He does report that he was shooting cocaine about a week ago, twice and he's never ever done this before in his life.  He denies using any other illicit drugs. No numbness or tingling in the left arm. No chest pain, but the pain does radiate from the left arm up towards the left shoulder. He has redness and swelling noted.   Past Medical History  Diagnosis Date  . Hepatitis C   . History of pancreatitis     Patient Active Problem List   Diagnosis Date Noted  . Abscess of left arm   . Chronic hepatitis C without hepatic coma ([place])   . Drug abuse     History reviewed. No pertinent past surgical history.  Current Outpatient Rx  Name  Route  Sig  Dispense  Refill  . cephALEXin (KEFLEX) 500 MG capsule   Oral   Take 1 capsule (500 mg total) by mouth 4 (four) times daily.   40 capsule   0   . lansoprazole (PREVACID) 30 MG capsule   Oral   Take 30 mg by mouth daily at 12 noon.         Marland Kitchen oxyCODONE-acetaminophen (ROXICET) 5-325 MG tablet   Oral   Take 1 tablet by mouth every 6 (six) hours as needed.   15 tablet   0   . pantoprazole (PROTONIX) 40 MG  tablet   Oral   Take 1 tablet (40 mg total) by mouth 2 (two) times daily.   60 tablet   1   . sucralfate (CARAFATE) 1 GM/10ML suspension   Oral   Take 10 mLs (1 g total) by mouth 4 (four) times daily.   420 mL   1   . sulfamethoxazole-trimethoprim (BACTRIM DS,SEPTRA DS) 800-160 MG tablet   Oral   Take 2 tablets by mouth 2 (two) times daily.   40 tablet   0   . traMADol (ULTRAM) 50 MG tablet   Oral   Take 1 tablet (50 mg total) by mouth every 6 (six) hours as needed.   10 tablet   0     Allergies Review of patient's allergies indicates no known allergies.  No family history on file.  Social History Social History  Substance Use Topics  . Smoking status: Current Every Day Smoker -- 0.50 packs/day    Types: Cigarettes  . Smokeless tobacco: None  . Alcohol Use: No     Comment: regular (heavy) ETOH consumption-denies alcohol use x1 week on 10/08/15    Review of Systems Constitutional: As objective fevers and chills Eyes: No visual changes. ENT: No sore throat. Cardiovascular: Denies chest pain. Respiratory: Denies shortness of breath. Gastrointestinal: No abdominal pain.  No nausea, no vomiting.  No diarrhea.  No constipation. Genitourinary: Negative for dysuria. Musculoskeletal: Negative for back pain. Skin: Negative for rash except is noted about the left arm. Neurological: Negative for headaches, focal weakness or numbness.  10-point ROS otherwise negative.  ____________________________________________   PHYSICAL EXAM:  VITAL SIGNS: ED Triage Vitals  Enc Vitals Group     BP 10/08/15 1635 148/103 mmHg     Pulse Rate 10/08/15 1635 93     Resp 10/08/15 1635 16     Temp 10/08/15 1635 98.6 F (37 C)     Temp Source 10/08/15 1635 Oral     SpO2 10/08/15 1635 97 %     Weight 10/08/15 1635 200 lb (90.719 kg)     Height 10/08/15 1635 6\' 1"  (1.854 m)     Head Cir --      Peak Flow --      Pain Score 10/08/15 1635 10     Pain Loc --      Pain Edu? --       Excl. in [place]? --    Constitutional: Alert and oriented. Well appearing and in no acute distress. Eyes: Conjunctivae are normal. PERRL. EOMI. Head: Atraumatic. Nose: No congestion/rhinnorhea. Mouth/Throat: Mucous membranes are moist.  Oropharynx non-erythematous. Neck: No stridor.   Cardiovascular: Normal rate, regular rhythm. Grossly normal heart sounds.  Good peripheral circulation. Respiratory: Normal respiratory effort.  No retractions. Lungs CTAB. Gastrointestinal: Soft and nontender. No distention. No abdominal bruits. No CVA tenderness. Musculoskeletal: No lower extremity tenderness nor edema.  No joint effusions. Right upper extremity normal. Left upper extremity medial ulnar and radial nerves neuromuscular intact. No normal forearm. All joints normal. No erythema overlying the joint. The patient has over the left volar distal arm and area of erythema and swelling with erythema that extends circumferentially around what appears to be a probable abscess centrally. Bedside ultrasound demonstrates what appears to be in pain noted in the middle of this, and appears that the area of swelling induration goes at least 3 cm deep. There is no erythema extending into the chest or upper arm, there is no erythema extending down to the hand. Neurologic:  Normal speech and language. No gross focal neurologic deficits are appreciated. No gait instability. Skin:  Skin is warm, dry and intact. No rash noted. Psychiatric: Mood and affect are normal. Speech and behavior are normal.  ____________________________________________   LABS (all labs ordered are listed, but only abnormal results are displayed)  Labs Reviewed  CULTURE, BLOOD (ROUTINE X 2)  CULTURE, BLOOD (ROUTINE X 2)  COMPREHENSIVE METABOLIC PANEL  LACTIC ACID, PLASMA  CBC WITH DIFFERENTIAL/PLATELET    ____________________________________________  EKG   ____________________________________________  RADIOLOGY   ____________________________________________   PROCEDURES  Procedure(s) performed: None  Critical Care performed: No  ____________________________________________   INITIAL IMPRESSION / ASSESSMENT AND PLAN / ED COURSE  Pertinent labs & imaging results that were available during my care of the patient were reviewed by me and considered in my medical decision making (see chart for details).  Patient presents with what appears to be cellulitis and associated probable abscess on the left upper arm after he IV drug injection. I will treat him for pain with morphine here, but do not anticipate discharging him with any narcotics given his substance abuse. Based on bedside ultrasound I'm suspicious for the possibility of abscess versus possible, but seemingly less likely pseudoaneurysm of his vessel. I have requested general surgery consult Dr. Connye Burkitt will evaluate further in  the ER. He has no signs or symptoms of sepsis, at this point appears to be localized abscess with associated cellulitis and I will place him on vancomycin anticipate discharge on Bactrim and Keflex with close follow-up care likely with general surgery.  ----------------------------------------- 7:21 PM on 10/08/2015 -----------------------------------------  Incision and drainage performed by general surgery. Patient tolerated well. Discharged patient to home, no signs or symptoms of sepsis. Place the patient on antibiotics, and follow-up plan in place which she understands well to see general surgery on Monday. Careful return cautioned advised. Patient is not driving home  I will prescribe the patient a narcotic pain medicine due to their condition which I anticipate will cause at least moderate pain short term. I discussed with the patient safe use of narcotic pain medicines, and that they are not to  drive, work in dangerous areas, or ever take more than prescribed (no more than 1 pill every 6 hours). We discussed that this is the type of medication that "Alfonse Spruce" may have overdosed on and the risks of this type of medicine. Patient is very agreeable to only use as prescribed and to never use more than prescribed.  ____________________________________________   FINAL CLINICAL IMPRESSION(S) / ED DIAGNOSES  Final diagnoses:  Abscess of left arm  Drug abuse      Delman Kitten, MD 10/08/15 1921

## 2015-10-08 NOTE — Discharge Instructions (Signed)
Follow-up with [facility] on Monday.   You have been seen in the Emergency Department (ED) today for an abscess.  This was drained in the ED.  Please follow up with your doctor in 24-48 hours for recheck of your wound.  Read through the additional discharge instructions included below regarding wound care recommendations.  Call your doctor sooner or return to the ED if you develop worsening signs of infection such as: increased redness, increased pain, pus, or fever.   Abscess An abscess is an infected area that contains a collection of pus and debris.It can occur in almost any part of the body. An abscess is also known as a furuncle or boil. CAUSES  An abscess occurs when tissue gets infected. This can occur from blockage of oil or sweat glands, infection of hair follicles, or a minor injury to the skin. As the body tries to fight the infection, pus collects in the area and creates pressure under the skin. This pressure causes pain. People with weakened immune systems have difficulty fighting infections and get certain abscesses more often.  SYMPTOMS Usually an abscess develops on the skin and becomes a painful mass that is red, warm, and tender. If the abscess forms under the skin, you may feel a moveable soft area under the skin. Some abscesses break open (rupture) on their own, but most will continue to get worse without care. The infection can spread deeper into the body and eventually into the bloodstream, causing you to feel ill.  DIAGNOSIS  Your caregiver will take your medical history and perform a physical exam. A sample of fluid may also be taken from the abscess to determine what is causing your infection. TREATMENT  Your caregiver may prescribe antibiotic medicines to fight the infection. However, taking antibiotics alone usually does not cure an abscess. Your caregiver may need to make a small cut (incision) in the abscess to drain the pus. In some cases, gauze is packed into the  abscess to reduce pain and to continue draining the area. HOME CARE INSTRUCTIONS   Only take over-the-counter or prescription medicines for pain, discomfort, or fever as directed by your caregiver.  If you were prescribed antibiotics, take them as directed. Finish them even if you start to feel better.  If gauze is used, follow your caregiver's directions for changing the gauze.  To avoid spreading the infection:  Keep your draining abscess covered with a bandage.  Wash your hands well.  Do not share personal care items, towels, or whirlpools with others.  Avoid skin contact with others.  Keep your skin and clothes clean around the abscess.  Keep all follow-up appointments as directed by your caregiver. SEEK MEDICAL CARE IF:   You have increased pain, swelling, redness, fluid drainage, or bleeding.  You have muscle aches, chills, or a general ill feeling.  You have a fever. MAKE SURE YOU:   Understand these instructions.  Will watch your condition.  Will get help right away if you are not doing well or get worse.   This information is not intended to replace advice given to you by your health care provider. Make sure you discuss any questions you have with your health care provider.   Document Released: 08/09/2005 Document Revised: 04/30/2012 Document Reviewed: 01/12/2012 Elsevier Interactive Patient Education [employer].

## 2015-10-08 NOTE — ED Notes (Addendum)
Pt reports abscess to left upper arm on Monday night; reports recent IV drug use (cocaine). Has been seen for this multiple times. Pt now with cellulitis spreading to shoulder/neck area. Pt reports taking Ibu and naprosyn around the clock, reports fever of 102 at home.

## 2015-10-08 NOTE — H&P (Signed)
Larry Parrish is an 39 y.o. male.   Chief Complaint: Left arm abscess HPI:  39 yr old male with a left arm abscess. Patient states that he had used cocaine a few days prior in that arm. He states it has been tender and increasing pain, radiating around the arm and up to the shoulder area. He has had fever and chills but no drainage.  He has never had any abscess in the past.   Past Medical History  Diagnosis Date  . Hepatitis C   . History of pancreatitis     History reviewed. No pertinent past surgical history.  No family history on file. Social History:  reports that he has been smoking Cigarettes.  He has been smoking about 0.50 packs per day. He does not have any smokeless tobacco history on file. He reports that he uses illicit drugs (Cocaine). He reports that he does not drink alcohol.  Allergies: No Known Allergies   (Not in a hospital admission)  Results for orders placed or performed during the hospital encounter of 10/08/15 (from the past 48 hour(s))  Comprehensive metabolic panel     Status: None   Collection Time: 10/08/15  4:46 PM  Result Value Ref Range   Sodium 138 135 - 145 mmol/L   Potassium 4.0 3.5 - 5.1 mmol/L   Chloride 105 101 - 111 mmol/L   CO2 26 22 - 32 mmol/L   Glucose, Bld 98 65 - 99 mg/dL   BUN 15 6 - 20 mg/dL   Creatinine, Ser 0.99 0.61 - 1.24 mg/dL   Calcium 9.0 8.9 - 10.3 mg/dL   Total Protein 7.4 6.5 - 8.1 g/dL   Albumin 3.9 3.5 - 5.0 g/dL   AST 28 15 - 41 U/L   ALT 40 17 - 63 U/L   Alkaline Phosphatase 60 38 - 126 U/L   Total Bilirubin 0.5 0.3 - 1.2 mg/dL   GFR calc non Af Amer >60 >60 mL/min   GFR calc Af Amer >60 >60 mL/min    Comment: (NOTE) The eGFR has been calculated using the CKD EPI equation. This calculation has not been validated in all clinical situations. eGFR's persistently <60 mL/min signify possible Chronic Kidney Disease.    Anion gap 7 5 - 15  Lactic acid, plasma     Status: None   Collection Time: 10/08/15  4:46 PM   Result Value Ref Range   Lactic Acid, Venous 1.1 0.5 - 2.0 mmol/L  CBC with Differential     Status: None   Collection Time: 10/08/15  4:46 PM  Result Value Ref Range   WBC 7.5 3.8 - 10.6 K/uL   RBC 4.54 4.40 - 5.90 MIL/uL   Hemoglobin 14.5 13.0 - 18.0 g/dL   HCT 42.3 40.0 - 52.0 %   MCV 93.1 80.0 - 100.0 fL   MCH 32.0 26.0 - 34.0 pg   MCHC 34.4 32.0 - 36.0 g/dL   RDW 12.3 11.5 - 14.5 %   Platelets 211 150 - 440 K/uL   Neutrophils Relative % 63 %   Neutro Abs 4.8 1.4 - 6.5 K/uL   Lymphocytes Relative 20 %   Lymphs Abs 1.5 1.0 - 3.6 K/uL   Monocytes Relative 14 %   Monocytes Absolute 1.0 0.2 - 1.0 K/uL   Eosinophils Relative 2 %   Eosinophils Absolute 0.1 0 - 0.7 K/uL   Basophils Relative 1 %   Basophils Absolute 0.1 0 - 0.1 K/uL   No results  found.  Review of Systems  Constitutional: Positive for fever and chills. Negative for weight loss, malaise/fatigue and diaphoresis.  HENT: Negative for congestion.   Respiratory: Negative for cough and sputum production.   Cardiovascular: Negative for chest pain, palpitations and leg swelling.  Gastrointestinal: Negative for nausea, vomiting, abdominal pain, diarrhea, constipation and blood in stool.  Genitourinary: Negative for dysuria, urgency and hematuria.  Musculoskeletal: Negative for back pain, joint pain and neck pain.  Skin: Negative for itching and rash.  Neurological: Negative for dizziness, weakness and headaches.  Psychiatric/Behavioral: Negative for depression.  All other systems reviewed and are negative.   Blood pressure 141/99, pulse 81, temperature 98 F (36.7 C), temperature source Oral, resp. rate 18, height '6\' 1"'  (1.854 m), weight 200 lb (90.719 kg), SpO2 99 %. Physical Exam  Vitals reviewed. Constitutional: He is oriented to person, place, and time. He appears well-developed and well-nourished. No distress.  HENT:  Head: Normocephalic and atraumatic.  Right Ear: External ear normal.  Left Ear: External ear  normal.  Nose: Nose normal.  Mouth/Throat: Oropharynx is clear and moist.  Eyes: Conjunctivae and EOM are normal. Pupils are equal, round, and reactive to light. No scleral icterus.  Neck: Normal range of motion. Neck supple. No tracheal deviation present.  Cardiovascular: Normal rate, regular rhythm, normal heart sounds and intact distal pulses.  Exam reveals no gallop and no friction rub.   No murmur heard. Respiratory: Effort normal and breath sounds normal. No respiratory distress. He has no wheezes.  GI: Soft. Bowel sounds are normal. He exhibits no distension.  Musculoskeletal: Normal range of motion. He exhibits edema and tenderness.  Decreased ROM in left arm due to pain  Neurological: He is alert and oriented to person, place, and time. No cranial nerve deficit.  Skin: Skin is warm and dry. No rash noted. There is erythema.  3cm area of cellulitis and erythema about 3 cm superior to antecutial fossa with induration      Assessment/Plan 39 yr old male with left arm abscess.  I discussed the risks and benefits of incision and drainage including bleeding, worsening infection and need for further procedures. Patient given the opportunity to ask question sand have them answered.     Incision and Drainage Procedure Note  Pre-operative Diagnosis: Left arm abscess  Post-operative Diagnosis: same  Anesthesia: 1% plain lidocaine  Procedure Details  The procedure, risks and complications have been discussed in detail (including, but not limited to airway compromise, infection, bleeding) with the patient, and the patient has signed consent to the procedure.  The skin was sterilely prepped and draped over the affected area in the usual fashion. After adequate local anesthesia, cruciate incision was made with a #11 blade was on the left arm. Immediate drainage of purulent material about 20cc was expressed.  Wound was packed with iodoform gauze.   Findings: Purulent drainage from abscess  cavity  EBL: 5 cc's  Patient tolerated the procedure well.  He and signifcant other given instructions to pull about an inch of packing out a day and cut the excess.  He will go home with antibiotics; Bactrim as well.  He is to call [facility] office on Monday to get an appointment for next week.   Catherine L Loflin 10/08/2015, 7:01 PM

## 2015-10-13 ENCOUNTER — Ambulatory Visit (INDEPENDENT_AMBULATORY_CARE_PROVIDER_SITE_OTHER): Payer: Self-pay | Admitting: Surgery

## 2015-10-13 ENCOUNTER — Encounter: Payer: Self-pay | Admitting: Surgery

## 2015-10-13 VITALS — BP 138/91 | HR 85 | Temp 99.6°F | Ht 73.0 in | Wt 196.6 lb

## 2015-10-13 DIAGNOSIS — G8929 Other chronic pain: Secondary | ICD-10-CM

## 2015-10-13 DIAGNOSIS — R1013 Epigastric pain: Secondary | ICD-10-CM

## 2015-10-13 DIAGNOSIS — L02414 Cutaneous abscess of left upper limb: Secondary | ICD-10-CM

## 2015-10-13 DIAGNOSIS — B182 Chronic viral hepatitis C: Secondary | ICD-10-CM

## 2015-10-13 LAB — CULTURE, BLOOD (ROUTINE X 2): CULTURE: NO GROWTH

## 2015-10-13 NOTE — Progress Notes (Signed)
Subjective:     Patient ID: Larry Parrish, male   DOB: [birth year], 39 y.o.   MRN: [id number]  HPI 39 yr old male with left arm abscess and chronic epigastric abdominal pain.  Patient states incision site doing well, he states he has been keeping it clean and removing a small amount of packing a day as planned. He has been taking his antibiotic, tomorrow will be day seven.  He denies any fever, chills, redness, drainage, increased pain in left arm.  Patient does endorse months of increasing epigastric pain.  He states at times it will be a 8/10 pain and is happening everyday at this point.  He states even drinking water will cause the pain and that he gets it every time he eats.  He has been on prontonix and carafate for this but has been taken them all at the same time.  He states he feels the problem is worsening despite this.  He has had some nausea but no vomiting along with pain.  He denies any fever, chills, chest pain, sob,vomtiing, constipation, diarrhea, melena, bloody stools or dysuria.    Past Medical History  Diagnosis Date  . Hepatitis C   . History of pancreatitis   . GI bleed    Past Surgical History  Procedure Laterality Date  . I&d extremity Left 10/08/2015    Arm  . Hand surgery Left 2008   Family History  Problem Relation Age of Onset  . Stroke Mother   . Heart disease Mother   . Arthritis Mother   . Cancer Father     Pancreatic  . Arthritis Daughter     Rheumatoid   Social History   Social History  . Marital Status: Single    Spouse Name: N/A  . Number of Children: N/A  . Years of Education: N/A   Social History Main Topics  . Smoking status: Current Every Day Smoker -- 0.50 packs/day    Types: Cigarettes  . Smokeless tobacco: Never Used  . Alcohol Use: Yes     Comment: regular (heavy) ETOH consumption-denies alcohol use x1 week on 10/08/15  . Drug Use: Yes    Special: Cocaine  . Sexual Activity: Not Asked   Other Topics Concern  . None   Social  History Narrative    Current outpatient prescriptions:  .  cephALEXin (KEFLEX) 500 MG capsule, Take 1 capsule (500 mg total) by mouth 4 (four) times daily., Disp: 40 capsule, Rfl: 0 .  lansoprazole (PREVACID) 30 MG capsule, Take 30 mg by mouth daily at 12 noon., Disp: , Rfl:  .  pantoprazole (PROTONIX) 40 MG tablet, Take 1 tablet (40 mg total) by mouth 2 (two) times daily., Disp: 60 tablet, Rfl: 1 .  sucralfate (CARAFATE) 1 GM/10ML suspension, Take 10 mLs (1 g total) by mouth 4 (four) times daily., Disp: 420 mL, Rfl: 1 .  sulfamethoxazole-trimethoprim (BACTRIM DS,SEPTRA DS) 800-160 MG tablet, Take 2 tablets by mouth 2 (two) times daily., Disp: 40 tablet, Rfl: 0 No Known Allergies     Review of Systems  Constitutional: Positive for appetite change and unexpected weight change. Negative for fever, chills, activity change and fatigue.  HENT: Positive for congestion. Negative for sore throat.   Respiratory: Negative for cough, shortness of breath and wheezing.   Cardiovascular: Negative for chest pain, palpitations and leg swelling.  Gastrointestinal: Positive for nausea and abdominal pain. Negative for vomiting, diarrhea, constipation, blood in stool, abdominal distention, anal bleeding and rectal pain.  Genitourinary:  Negative for dysuria and hematuria.  Musculoskeletal: Negative for back pain and joint swelling.  Skin: Negative for color change, pallor and rash.  Neurological: Negative for dizziness and weakness.  Hematological: Negative for adenopathy. Does not bruise/bleed easily.  Psychiatric/Behavioral: Negative for agitation. The patient is not nervous/anxious.   All other systems reviewed and are negative.      Filed Vitals:   10/13/15 1558  BP: 138/91  Pulse: 85  Temp: 99.6 F (37.6 C)    Objective:   Physical Exam  Constitutional: He is oriented to person, place, and time. He appears well-developed and well-nourished. No distress.  HENT:  Head: Normocephalic and  atraumatic.  Right Ear: External ear normal.  Left Ear: External ear normal.  Nose: Nose normal.  Mouth/Throat: Oropharynx is clear and moist.  Eyes: Conjunctivae and EOM are normal. Pupils are equal, round, and reactive to light. No scleral icterus.  Neck: Normal range of motion. Neck supple. No tracheal deviation present.  Cardiovascular: Normal rate, regular rhythm and normal heart sounds.   Pulmonary/Chest: Effort normal and breath sounds normal. No respiratory distress. He has no wheezes.  Abdominal: Soft. Bowel sounds are normal. He exhibits no distension. There is tenderness. There is no rebound.  Epigastric to LUQ tenderness, no fluid wave, non distended  Musculoskeletal: Normal range of motion. He exhibits no edema or tenderness.  Neurological: He is alert and oriented to person, place, and time. No cranial nerve deficit.  Skin: Skin is warm and dry. No rash noted. No erythema.  Left arm wound: clean, dry, no erythema, very clean base with good granulation tissue, healing well.   Psychiatric: He has a normal mood and affect. His behavior is normal. Judgment and thought content normal.  Vitals reviewed.      Assessment:     39 yr old male with left arm abscess and chronic epigastric pain and Hep C     Plan:     Left arm abscess: improving, healing well, keep wound clean and dry dressing on until completely healed  Chronic Epigastric pain:  Discussed that he cannot take the carafate along with other meds.  Discussed he should take the carafate about an hour before he eats and then take his other meds after eating.  Also discussed using some Maalox or Pepto to help coat the stomach.  I discussed that he should have an EGD to evaluate this pain and potentially take biopsies of any areas found.  He does not currently have a gastroenterologist and would like to see one closer to home.  Will set up with Dr. Allen Norris for the chronic epigastric pain as well as follow up of chronic hep C.

## 2015-10-13 NOTE — Patient Instructions (Addendum)
You will be scheduled at [facility] on 10/21/15 for an Endoscopy with Dr. Allen Norris. You should be expecting a call from his nurse to review medication and history with you.  Please refer to surgery center instructions for any further information regarding procedure.  You will then follow-up with Dr. Allen Norris in the office to review your endoscopy and to discuss your Hepatitis C.  Your incision from your abscess looks great! Keep this covered until the skin is completely sealed.  Please call with any questions or concerns.

## 2015-10-14 ENCOUNTER — Telehealth: Payer: Self-pay

## 2015-10-14 NOTE — Telephone Encounter (Signed)
Patient seen in office on 10/13/15 for post-op I&D of Left Antecubital Abscess. Doing well from surgical standpoint.  Explains while in office that he has been having difficulty swallowing for last several weeks and is to point that he is only holding down water. Patient also has Hepatitis C. Patient discussed both with Dr. Azalee Course in office and she suggests that patient see GI. Patient states that he could not get in with GI until 11/29/14 at [facility].   Call was made to Dr. Dorothey Baseman nurse at this time, who will schedule patient for Endoscopy with Dr. Allen Norris on 10/21/15 at [facility]. Instructions were given to patient at appointment and history and medications were updated. Patient will then need follow-up in office to discuss results of Endoscopy and Hepatitis C.

## 2015-10-15 ENCOUNTER — Other Ambulatory Visit: Payer: Self-pay

## 2015-10-15 NOTE — Telephone Encounter (Signed)
Pt has been scheduled for EGD at [facility] on 10/21/15. I contacted pt this morning to let him know this was scheduled and make sure he understood his instructions.

## 2015-10-18 ENCOUNTER — Other Ambulatory Visit: Payer: Self-pay

## 2015-10-18 ENCOUNTER — Telehealth: Payer: Self-pay

## 2015-10-18 NOTE — Telephone Encounter (Signed)
Sorry, that was me. I thought Amber had made it. I had a cancellation tomorrow in [place] at 2:30 and scheduled him for that. Thanks!

## 2015-10-18 NOTE — Telephone Encounter (Signed)
Ginger,  Amber stated that you were suppose to schedule a follow up appointment for patient after he has his procedure with Dr. Allen Norris. He needs a follow up appointment to go over his Endoscopy and Hepatitis C.

## 2015-10-19 ENCOUNTER — Encounter: Payer: Self-pay | Admitting: Gastroenterology

## 2015-10-19 ENCOUNTER — Other Ambulatory Visit: Payer: Self-pay

## 2015-10-19 ENCOUNTER — Encounter: Payer: Self-pay | Admitting: *Deleted

## 2015-10-19 ENCOUNTER — Ambulatory Visit (INDEPENDENT_AMBULATORY_CARE_PROVIDER_SITE_OTHER): Payer: Self-pay | Admitting: Gastroenterology

## 2015-10-19 VITALS — BP 145/98 | HR 84 | Temp 98.3°F | Wt 201.0 lb

## 2015-10-19 DIAGNOSIS — B182 Chronic viral hepatitis C: Secondary | ICD-10-CM

## 2015-10-19 NOTE — Progress Notes (Addendum)
Gastroenterology Consultation  Referring Provider:     No ref. provider found Primary Care Physician:  Pcp Not In System Primary Gastroenterologist:  Dr. Allen Norris     Reason for Consultation:     Hepatitis C        HPI:   Larry Parrish is a 39 y.o. y/o male referred for consultation & management of hepatitis C by Dr. Merryl Hacker Not In System.  This patient comes today after being told many years ago that he has hepatitis C. The patient has never been offered treatment in the past as reported by him. The patient was followed at [facility] and states that it was very hard to get into the liver clinic. The patient also reports that he has a history of IV drug use and multiple homemade tattoos. The patient also states that he has been admitted to the hospital with recurrent pancreatitis. He used to drink very heavily now only drinks socially. The patient also suffers from chronic pain. The patient last used IV drugs a few weeks ago. He also states that he has been on pain medication for his chronic abdominal pain. He recently had a hospitalization for an abscess in his left arm. The patient denies any unexplained weight loss and states he has gained 6 pounds in the last few days. There is no report of any nausea or vomiting fevers or chills.  Past Medical History  Diagnosis Date  . History of pancreatitis   . GI bleed   . Hepatitis C     Past Surgical History  Procedure Laterality Date  . I&d extremity Left 10/08/2015    Arm  . Hand surgery Left 2008    Prior to Admission medications   Medication Sig Start Date End Date Taking? Authorizing Provider  cephALEXin (KEFLEX) 500 MG capsule Take 1 capsule (500 mg total) by mouth 4 (four) times daily. Patient not taking: Reported on 10/19/2015 10/08/15   Delman Kitten, MD  lansoprazole (PREVACID) 30 MG capsule Take 30 mg by mouth daily at 12 noon.    Historical Provider, MD  pantoprazole (PROTONIX) 40 MG tablet Take 1 tablet (40 mg total) by mouth 2 (two) times  daily. Patient not taking: Reported on 10/19/2015 10/05/15   Hinda Kehr, MD  sucralfate (CARAFATE) 1 GM/10ML suspension Take 10 mLs (1 g total) by mouth 4 (four) times daily. Patient not taking: Reported on 10/19/2015 10/05/15   Hinda Kehr, MD  sulfamethoxazole-trimethoprim (BACTRIM DS,SEPTRA DS) 800-160 MG tablet Take 2 tablets by mouth 2 (two) times daily. Patient not taking: Reported on 10/19/2015 10/08/15   Delman Kitten, MD  traMADol (ULTRAM) 50 MG tablet Take 50 mg by mouth every 6 (six) hours as needed. 10/08/15   Historical Provider, MD    Family History  Problem Relation Age of Onset  . Stroke Mother   . Heart disease Mother   . Arthritis Mother   . Cancer Father     Pancreatic  . Arthritis Daughter     Rheumatoid     Social History  Substance Use Topics  . Smoking status: Current Every Day Smoker -- 0.50 packs/day for 15 years    Types: Cigarettes  . Smokeless tobacco: Never Used  . Alcohol Use: Yes     Comment: regular (heavy) ETOH consumption-denies alcohol use x1 week on 10/08/15    Allergies as of 10/19/2015  . (No Known Allergies)    Review of Systems:    All systems reviewed and negative except where noted  in HPI.   Physical Exam:  BP 145/98 mmHg  Pulse 84  Temp(Src) 98.3 F (36.8 C) (Oral)  Wt 201 lb (91.173 kg) No LMP for male patient. Psych:  Alert and cooperative. Normal mood and affect. General:   Alert,  Well-developed, well-nourished, pleasant and cooperative in NAD Head:  Normocephalic and atraumatic. Eyes:  Sclera clear, no icterus.   Conjunctiva pink. Ears:  Normal auditory acuity. Nose:  No deformity, discharge, or lesions. Mouth:  No deformity or lesions,oropharynx pink & moist. Neck:  Supple; no masses or thyromegaly. Lungs:  Respirations even and unlabored.  Clear throughout to auscultation.   No wheezes, crackles, or rhonchi. No acute distress. Heart:  Regular rate and rhythm; no murmurs, clicks, rubs, or gallops. Abdomen:  Normal  bowel sounds.  No bruits.  Soft, non-tender and non-distended without masses, hepatosplenomegaly or hernias noted.  No guarding or rebound tenderness.  Negative Carnett sign.   Rectal:  Deferred.  Msk:  Symmetrical without gross deformities.  Good, equal movement & strength bilaterally. Pulses:  Normal pulses noted. Extremities:  No clubbing or edema.  No cyanosis. Neurologic:  Alert and oriented x3;  grossly normal neurologically. Skin:  Intact without significant lesions or rashes.  No jaundice. Lymph Nodes:  No significant cervical adenopathy. Psych:  Alert and cooperative. Normal mood and affect.  Imaging Studies: Ct Abdomen Pelvis W Contrast  10/05/2015  CLINICAL DATA:  Right upper quadrant and epigastric abdominal pain. Bloody stools. Hepatitis-C. EXAM: CT ABDOMEN AND PELVIS WITH CONTRAST TECHNIQUE: Multidetector CT imaging of the abdomen and pelvis was performed using the standard protocol following bolus administration of intravenous contrast. CONTRAST:  186mL OMNIPAQUE IOHEXOL 350 MG/ML SOLN COMPARISON:  11/13/2014 FINDINGS: Lower chest: Normal heart size. No pericardial or pleural effusion. Clear lung bases. Hepatobiliary: Liver demonstrates no focal abnormality or biliary dilatation. Patent hepatic and portal veins. Gallbladder is collapsed. No biliary dilatation. Stable small porta hepatis lymph nodes as before. Pancreas: No mass, inflammatory changes, or other significant abnormality. Spleen: Within normal limits in size and appearance. Adrenals/Urinary Tract: No masses identified. No evidence of hydronephrosis. Stomach/Bowel: Negative for bowel obstruction, significant dilatation, ileus, or free air. Normal appendix. No acute inflammatory process. Vascular/Lymphatic: No adenopathy. Negative for aneurysm. No vascular abnormality. Reproductive: No mass or other significant abnormality. Other: No inguinal abnormality or hernia. Musculoskeletal: No acute osseous finding. Minor degenerative  change at L5-S1. IMPRESSION: No acute intra-abdominal or pelvic finding by CT. Normal appendix. No interval change. Electronically Signed   By: Jerilynn Mages.  Shick M.D.   On: 10/05/2015 21:16   US Abdomen Limited Ruq  10/03/2015  CLINICAL DATA:  Right upper quadrant pain for 5 days. Patient recently ate and drank. EXAM: US ABDOMEN LIMITED - RIGHT UPPER QUADRANT COMPARISON:  CT abdomen and pelvis 11/13/2014 FINDINGS: Gallbladder: Gallbladder is contracted, likely physiologic in the nonfasting state. No stones or sludge appreciated. Gallbladder wall thickness is upper limits of normal, likely due to physiologic contraction. Murphy's sign is negative. Common bile duct: Diameter: 3.9 mm, normal Liver: Visualization is somewhat limited due to rib artifact. Suggestion of mild diffusely increased echotexture suggesting fatty infiltration. No focal liver lesions identified. IMPRESSION: Probable physiologic contraction of the gallbladder. No evidence suggesting cholelithiasis or cholecystitis. Probable mild fatty infiltration of the liver. Electronically Signed   By: Lucienne Capers M.D.   On: 10/03/2015 19:37    Assessment and Plan:   Larry Parrish is a 39 y.o. y/o male who comes in today with a history of hepatitis C.  The patient had a ultrasound that did not show any sign of cirrhosis but did show increased echogenicity consistent with mild diffuse fatty liver disease. Patient has stated he has been set up for a upper endoscopy by me this week. The patient will also have labs sent off to look for other possible causes of liver disease. He has been told to abstain from IV drug use and alcohol abuse for at least 6 months and then he will be treated for his hepatitis C. The patient states he agrees with the plan and will stop his drug abuse and alcohol abuse.   Note: This dictation was prepared with Dragon dictation along with smaller phrase technology. Any transcriptional errors that result from this process are  unintentional.

## 2015-10-20 ENCOUNTER — Telehealth: Payer: Self-pay | Admitting: Surgery

## 2015-10-20 ENCOUNTER — Telehealth: Payer: Self-pay | Admitting: Gastroenterology

## 2015-10-20 ENCOUNTER — Other Ambulatory Visit
Admission: RE | Admit: 2015-10-20 | Discharge: 2015-10-20 | Disposition: A | Discharge status: Home / Self Care | Payer: Medicaid Other | Source: Ambulatory Visit | Attending: Gastroenterology | Admitting: Gastroenterology

## 2015-10-20 DIAGNOSIS — B182 Chronic viral hepatitis C: Secondary | ICD-10-CM | POA: Diagnosis not present

## 2015-10-20 LAB — CBC WITH DIFFERENTIAL/PLATELET
Basophils Absolute: 0.1 10*3/uL (ref 0–0.1)
Basophils Relative: 1 %
Eosinophils Absolute: 0 10*3/uL (ref 0–0.7)
Eosinophils Relative: 1 %
HEMATOCRIT: 46.3 % (ref 40.0–52.0)
Hemoglobin: 15.8 g/dL (ref 13.0–18.0)
LYMPHS ABS: 1.1 10*3/uL (ref 1.0–3.6)
LYMPHS PCT: 16 %
MCH: 31.6 pg (ref 26.0–34.0)
MCHC: 34 g/dL (ref 32.0–36.0)
MCV: 93.1 fL (ref 80.0–100.0)
MONO ABS: 0.8 10*3/uL (ref 0.2–1.0)
MONOS PCT: 11 %
NEUTROS ABS: 4.9 10*3/uL (ref 1.4–6.5)
Neutrophils Relative %: 71 %
Platelets: 339 10*3/uL (ref 150–440)
RBC: 4.98 MIL/uL (ref 4.40–5.90)
RDW: 12.8 % (ref 11.5–14.5)
WBC: 6.9 10*3/uL (ref 3.8–10.6)

## 2015-10-20 LAB — HEPATIC FUNCTION PANEL
ALT: 238 U/L — ABNORMAL HIGH (ref 17–63)
AST: 188 U/L — ABNORMAL HIGH (ref 15–41)
Albumin: 4.7 g/dL (ref 3.5–5.0)
Alkaline Phosphatase: 62 U/L (ref 38–126)
BILIRUBIN DIRECT: 0.2 mg/dL (ref 0.1–0.5)
BILIRUBIN TOTAL: 1 mg/dL (ref 0.3–1.2)
Indirect Bilirubin: 0.8 mg/dL (ref 0.3–0.9)
Total Protein: 8.3 g/dL — ABNORMAL HIGH (ref 6.5–8.1)

## 2015-10-20 LAB — PROTIME-INR
INR: 0.99
PROTHROMBIN TIME: 13.3 s (ref 11.4–15.0)

## 2015-10-20 NOTE — Discharge Instructions (Signed)

## 2015-10-20 NOTE — Telephone Encounter (Signed)
Patient has been advised that [facility] will not cover his office visits with [facility] nor his imaging that has been ordered. He is aware that he will be considered self pay at this time. He has informed me that he has turned all documentation in to his case worker at [place] to apply for Adult Medicaid. He was informed to advise his Medicaid Worker of the visits so that they may retro these visits so they may get paid.

## 2015-10-20 NOTE — Telephone Encounter (Signed)
Patient has called and stated that when he went to [facility] to have his labs drawn they would not draw the labs due to a balance that he owes. He would like to know if he can have orders sent to the hospital to have these drawn. Please call patient at number in chart to advise him if this can be done and if so when he is able to go.

## 2015-10-20 NOTE — Telephone Encounter (Signed)
Called pt and notified him he can go to [facility] lab to have blood drawn.

## 2015-10-21 ENCOUNTER — Ambulatory Visit: Payer: Medicaid Other | Admitting: Anesthesiology

## 2015-10-21 ENCOUNTER — Other Ambulatory Visit: Payer: Self-pay | Admitting: Gastroenterology

## 2015-10-21 ENCOUNTER — Encounter
Admission: RE | Disposition: A | Discharge status: Home / Self Care | Payer: Self-pay | Source: Ambulatory Visit | Attending: Gastroenterology

## 2015-10-21 ENCOUNTER — Ambulatory Visit
Admission: RE | Admit: 2015-10-21 | Discharge: 2015-10-21 | Disposition: A | Discharge status: Home / Self Care | Payer: Medicaid Other | Source: Ambulatory Visit | Attending: Gastroenterology | Admitting: Gastroenterology

## 2015-10-21 DIAGNOSIS — R1013 Epigastric pain: Secondary | ICD-10-CM | POA: Insufficient documentation

## 2015-10-21 DIAGNOSIS — F17210 Nicotine dependence, cigarettes, uncomplicated: Secondary | ICD-10-CM | POA: Diagnosis not present

## 2015-10-21 DIAGNOSIS — K2970 Gastritis, unspecified, without bleeding: Secondary | ICD-10-CM | POA: Insufficient documentation

## 2015-10-21 DIAGNOSIS — K3189 Other diseases of stomach and duodenum: Secondary | ICD-10-CM | POA: Insufficient documentation

## 2015-10-21 DIAGNOSIS — B1920 Unspecified viral hepatitis C without hepatic coma: Secondary | ICD-10-CM | POA: Diagnosis not present

## 2015-10-21 DIAGNOSIS — K2100 Gastro-esophageal reflux disease with esophagitis, without bleeding: Secondary | ICD-10-CM | POA: Insufficient documentation

## 2015-10-21 HISTORY — PX: ESOPHAGOGASTRODUODENOSCOPY (EGD) WITH PROPOFOL: SHX5813

## 2015-10-21 LAB — HEPATITIS B SURFACE ANTIGEN: HEP B S AG: NEGATIVE

## 2015-10-21 SURGERY — ESOPHAGOGASTRODUODENOSCOPY (EGD) WITH PROPOFOL
Anesthesia: Monitor Anesthesia Care | Wound class: Clean Contaminated

## 2015-10-21 MED ORDER — LIDOCAINE HCL (CARDIAC) 20 MG/ML IV SOLN
INTRAVENOUS | Status: DC | PRN
Start: 2015-10-21 — End: 2015-10-21
  Administered 2015-10-21: 20 mg via INTRAVENOUS

## 2015-10-21 MED ORDER — STERILE WATER FOR IRRIGATION IR SOLN
Status: DC | PRN
  Administered 2015-10-21: 09:00:00

## 2015-10-21 MED ORDER — GLYCOPYRROLATE 0.2 MG/ML IJ SOLN
INTRAMUSCULAR | Status: DC | PRN
  Administered 2015-10-21: 0.2 mg via INTRAVENOUS

## 2015-10-21 MED ORDER — ACETAMINOPHEN 160 MG/5ML PO SOLN: 325.0000 mg | ORAL | Status: DC | PRN

## 2015-10-21 MED ORDER — PROPOFOL 10 MG/ML IV BOLUS
INTRAVENOUS | Status: DC | PRN
  Administered 2015-10-21 (×2): 100 mg via INTRAVENOUS

## 2015-10-21 MED ORDER — ACETAMINOPHEN 325 MG PO TABS: 325.0000 mg | ORAL_TABLET | ORAL | Status: DC | PRN

## 2015-10-21 MED ORDER — LACTATED RINGERS IV SOLN
INTRAVENOUS | Status: DC
  Administered 2015-10-21 (×2): via INTRAVENOUS

## 2015-10-21 SURGICAL SUPPLY — 39 items

## 2015-10-21 NOTE — Anesthesia Procedure Notes (Signed)
Procedure Name: MAC Performed by: Rimas Gilham Pre-anesthesia Checklist: Patient identified, Emergency Drugs available, Suction available, Patient being monitored and Timeout performed Patient Re-evaluated:Patient Re-evaluated prior to inductionOxygen Delivery Method: Nasal cannula       

## 2015-10-21 NOTE — Transfer of Care (Signed)
Immediate Anesthesia Transfer of Care Note  Patient: Larry Parrish  Procedure(s) Performed: Procedure(s): ESOPHAGOGASTRODUODENOSCOPY (EGD) WITH PROPOFOL (N/A)  Patient Location: PACU  Anesthesia Type: MAC  Level of Consciousness: awake, alert  and patient cooperative  Airway and Oxygen Therapy: Patient Spontanous Breathing and Patient connected to supplemental oxygen  Post-op Assessment: Post-op Vital signs reviewed, Patient's Cardiovascular Status Stable, Respiratory Function Stable, Patent Airway and No signs of Nausea or vomiting  Post-op Vital Signs: Reviewed and stable  Complications: No apparent anesthesia complications

## 2015-10-21 NOTE — Anesthesia Preprocedure Evaluation (Signed)
Anesthesia Evaluation  Patient identified by MRN, date of birth, ID band  Reviewed: Allergy & Precautions, H&P , NPO status , Patient's Chart, lab work & pertinent test results  Airway Mallampati: II  TM Distance: >3 FB Neck ROM: full    Dental no notable dental hx.    Pulmonary Current Smoker,    Pulmonary exam normal        Cardiovascular  Rhythm:regular Rate:Normal     Neuro/Psych    GI/Hepatic (+) Hepatitis -, C  Endo/Other    Renal/GU      Musculoskeletal   Abdominal   Peds  Hematology   Anesthesia Other Findings   Reproductive/Obstetrics                             Anesthesia Physical Anesthesia Plan  ASA: III  Anesthesia Plan: MAC   Post-op Pain Management:    Induction:   Airway Management Planned:   Additional Equipment:   Intra-op Plan:   Post-operative Plan:   Informed Consent: I have reviewed the patients History and Physical, chart, labs and discussed the procedure including the risks, benefits and alternatives for the proposed anesthesia with the patient or authorized representative who has indicated his/her understanding and acceptance.     Plan Discussed with: CRNA  Anesthesia Plan Comments:         Anesthesia Quick Evaluation

## 2015-10-21 NOTE — Anesthesia Postprocedure Evaluation (Signed)
Anesthesia Post Note  Patient: Larry Parrish  Procedure(s) Performed: Procedure(s) (LRB): ESOPHAGOGASTRODUODENOSCOPY (EGD) WITH PROPOFOL (N/A)  Patient location during evaluation: PACU Anesthesia Type: MAC Level of consciousness: awake and alert and oriented Pain management: satisfactory to patient Vital Signs Assessment: post-procedure vital signs reviewed and stable Respiratory status: spontaneous breathing, nonlabored ventilation and respiratory function stable Cardiovascular status: blood pressure returned to baseline and stable Postop Assessment: Adequate PO intake and No signs of nausea or vomiting Anesthetic complications: no    Raliegh Ip

## 2015-10-21 NOTE — H&P (Signed)
[facility]  [address], [place], [place] [address] Phone: [phone] Fax : [phone]  Primary Care Physician:  Pcp Not In System Primary Gastroenterologist:  Dr. Allen Norris  Pre-Procedure History & Physical: HPI:  Larry Parrish is a 39 y.o. male is here for an endoscopy.   Past Medical History  Diagnosis Date  . History of pancreatitis   . GI bleed   . Hepatitis C     Past Surgical History  Procedure Laterality Date  . I&d extremity Left 10/08/2015    Arm  . Hand surgery Left 2008    Prior to Admission medications   Medication Sig Start Date End Date Taking? Authorizing Provider  cephALEXin (KEFLEX) 500 MG capsule Take 1 capsule (500 mg total) by mouth 4 (four) times daily. Patient not taking: Reported on 10/19/2015 10/08/15   Delman Kitten, MD  lansoprazole (PREVACID) 30 MG capsule Take 30 mg by mouth daily at 12 noon.    Historical Provider, MD  pantoprazole (PROTONIX) 40 MG tablet Take 1 tablet (40 mg total) by mouth 2 (two) times daily. Patient not taking: Reported on 10/19/2015 10/05/15   Hinda Kehr, MD  sucralfate (CARAFATE) 1 GM/10ML suspension Take 10 mLs (1 g total) by mouth 4 (four) times daily. Patient not taking: Reported on 10/19/2015 10/05/15   Hinda Kehr, MD  sulfamethoxazole-trimethoprim (BACTRIM DS,SEPTRA DS) 800-160 MG tablet Take 2 tablets by mouth 2 (two) times daily. Patient not taking: Reported on 10/19/2015 10/08/15   Delman Kitten, MD  traMADol (ULTRAM) 50 MG tablet Take 50 mg by mouth every 6 (six) hours as needed. 10/08/15   Historical Provider, MD    Allergies as of 10/15/2015  . (No Known Allergies)    Family History  Problem Relation Age of Onset  . Stroke Mother   . Heart disease Mother   . Arthritis Mother   . Cancer Father     Pancreatic  . Arthritis Daughter     Rheumatoid    Social History   Social History  . Marital Status: Single    Spouse Name: N/A  . Number of Children: N/A  . Years of Education:  N/A   Occupational History  . Not on file.   Social History Main Topics  . Smoking status: Current Every Day Smoker -- 0.50 packs/day for 15 years    Types: Cigarettes  . Smokeless tobacco: Never Used  . Alcohol Use: Yes     Comment: regular (heavy) ETOH consumption-denies alcohol use x1 week on 10/08/15  . Drug Use: Yes    Special: Cocaine  . Sexual Activity: Not on file   Other Topics Concern  . Not on file   Social History Narrative    Review of Systems: See HPI, otherwise negative ROS  Physical Exam: BP 132/95 mmHg  Pulse 78  Temp(Src) 98.4 F (36.9 C) (Temporal)  Resp 16  Ht 6\' 1"  (1.854 m)  Wt 195 lb (88.451 kg)  BMI 25.73 kg/m2  SpO2 97% General:   Alert,  pleasant and cooperative in NAD Head:  Normocephalic and atraumatic. Neck:  Supple; no masses or thyromegaly. Lungs:  Clear throughout to auscultation.    Heart:  Regular rate and rhythm. Abdomen:  Soft, nontender and nondistended. Normal bowel sounds, without guarding, and without rebound.   Neurologic:  Alert and  oriented x4;  grossly normal neurologically.  Impression/Plan: Larry Parrish is here for an endoscopy to be performed for epigatric pain  Risks, benefits, limitations, and alternatives regarding  endoscopy have been reviewed with the patient.  Questions have been answered.  All parties agreeable.   Ollen Bowl, MD  10/21/2015, 8:43 AM

## 2015-10-21 NOTE — Op Note (Signed)
[facility] Patient Name: Larry Parrish Procedure Date: 10/21/2015 8:43 AM MRN: [id number] Account #: [id number] Date of Birth: 07-25-1976 Admit Type: Outpatient Age: 39 Room: [facility] OR ROOM 01 Gender: Male Note Status: Finalized Procedure:         Upper GI endoscopy Indications:       Epigastric abdominal pain Providers:         Lucilla Lame, MD Referring MD:      Dr Juanda Crumble drew clinic, MD (Referring MD) Medicines:         Propofol per Anesthesia Complications:     No immediate complications. Procedure:         Pre-Anesthesia Assessment:                    - Prior to the procedure, a History and Physical was                     performed, and patient medications and allergies were                     reviewed. The patient's tolerance of previous anesthesia                     was also reviewed. The risks and benefits of the procedure                     and the sedation options and risks were discussed with the                     patient. All questions were answered, and informed consent                     was obtained. Prior Anticoagulants: The patient has taken                     no previous anticoagulant or antiplatelet agents. ASA                     Grade Assessment: II - A patient with mild systemic                     disease. After reviewing the risks and benefits, the                     patient was deemed in satisfactory condition to undergo                     the procedure.                    After obtaining informed consent, the endoscope was passed                     under direct vision. Throughout the procedure, the                     patient's blood pressure, pulse, and oxygen saturations                     were monitored continuously. The Olympus GIF H180J                     colonscope [id number]) was introduced through the mouth,  and advanced to the second part of duodenum. The upper GI         endoscopy was accomplished without difficulty. The patient                     tolerated the procedure well. Findings:      LA Grade A (one or more mucosal breaks less than 5 mm, not extending       between tops of 2 mucosal folds) esophagitis with no bleeding was found       at the gastroesophageal junction.      Localized moderate inflammation characterized by erosions was found in       the gastric antrum. Biopsies were taken with a cold forceps for       histology.      The examined duodenum was normal. Impression:        - LA Grade A reflux esophagitis.                    - Gastritis. Biopsied.                    - Normal examined duodenum. Recommendation:    - Await pathology results. Procedure Code(s): --- Professional ---                    [phone], Esophagogastroduodenoscopy, flexible, transoral;                     with biopsy, single or multiple Diagnosis Code(s): --- Professional ---                    R10.13, Epigastric pain                    K21.0, Gastro-esophageal reflux disease with esophagitis                    K29.70, Gastritis, unspecified, without bleeding CPT copyright 2014 [employer] Association. All rights reserved. The codes documented in this report are preliminary and upon coder review may  be revised to meet current compliance requirements. Lucilla Lame, MD 10/21/2015 8:57:57 AM This report has been signed electronically. Number of Addenda: 0 Note Initiated On: 10/21/2015 8:43 AM Total Procedure Duration: 0 hours 2 minutes 31 seconds       [facility]

## 2015-10-22 ENCOUNTER — Emergency Department: Payer: Medicaid Other

## 2015-10-22 ENCOUNTER — Emergency Department
Admission: EM | Admit: 2015-10-22 | Discharge: 2015-10-22 | Disposition: A | Discharge status: Home / Self Care | Payer: Medicaid Other | Attending: Emergency Medicine | Admitting: Emergency Medicine

## 2015-10-22 ENCOUNTER — Encounter: Payer: Self-pay | Admitting: Gastroenterology

## 2015-10-22 DIAGNOSIS — R109 Unspecified abdominal pain: Secondary | ICD-10-CM | POA: Diagnosis present

## 2015-10-22 DIAGNOSIS — Z79899 Other long term (current) drug therapy: Secondary | ICD-10-CM | POA: Insufficient documentation

## 2015-10-22 DIAGNOSIS — F17210 Nicotine dependence, cigarettes, uncomplicated: Secondary | ICD-10-CM | POA: Insufficient documentation

## 2015-10-22 DIAGNOSIS — Z792 Long term (current) use of antibiotics: Secondary | ICD-10-CM | POA: Insufficient documentation

## 2015-10-22 DIAGNOSIS — K209 Esophagitis, unspecified without bleeding: Secondary | ICD-10-CM

## 2015-10-22 DIAGNOSIS — R0602 Shortness of breath: Secondary | ICD-10-CM | POA: Insufficient documentation

## 2015-10-22 DIAGNOSIS — G8929 Other chronic pain: Secondary | ICD-10-CM | POA: Insufficient documentation

## 2015-10-22 DIAGNOSIS — K2970 Gastritis, unspecified, without bleeding: Secondary | ICD-10-CM | POA: Insufficient documentation

## 2015-10-22 LAB — URINALYSIS COMPLETE WITH MICROSCOPIC (ARMC ONLY)
BACTERIA UA: NONE SEEN
BILIRUBIN URINE: NEGATIVE
GLUCOSE, UA: NEGATIVE mg/dL
Hgb urine dipstick: NEGATIVE
KETONES UR: NEGATIVE mg/dL
Leukocytes, UA: NEGATIVE
Nitrite: NEGATIVE
PH: 7 (ref 5.0–8.0)
Protein, ur: NEGATIVE mg/dL
RBC / HPF: NONE SEEN RBC/hpf (ref 0–5)
SQUAMOUS EPITHELIAL / LPF: NONE SEEN
Specific Gravity, Urine: 1.006 (ref 1.005–1.030)

## 2015-10-22 LAB — COMPREHENSIVE METABOLIC PANEL
ALT: 486 U/L — ABNORMAL HIGH (ref 17–63)
ANION GAP: 6 (ref 5–15)
AST: 298 U/L — ABNORMAL HIGH (ref 15–41)
Albumin: 4.2 g/dL (ref 3.5–5.0)
Alkaline Phosphatase: 58 U/L (ref 38–126)
BUN: 17 mg/dL (ref 6–20)
CALCIUM: 9.4 mg/dL (ref 8.9–10.3)
CHLORIDE: 104 mmol/L (ref 101–111)
CO2: 28 mmol/L (ref 22–32)
Creatinine, Ser: 0.99 mg/dL (ref 0.61–1.24)
GFR calc non Af Amer: >60 mL/min (ref >60)
Glucose, Bld: 103 mg/dL — ABNORMAL HIGH (ref 65–99)
Potassium: 4.4 mmol/L (ref 3.5–5.1)
SODIUM: 138 mmol/L (ref 135–145)
Total Bilirubin: 1 mg/dL (ref 0.3–1.2)
Total Protein: 7.6 g/dL (ref 6.5–8.1)

## 2015-10-22 LAB — CBC
HCT: 45.3 % (ref 40.0–52.0)
HEMOGLOBIN: 15 g/dL (ref 13.0–18.0)
MCH: 31.2 pg (ref 26.0–34.0)
MCHC: 33.2 g/dL (ref 32.0–36.0)
MCV: 93.8 fL (ref 80.0–100.0)
Platelets: 287 10*3/uL (ref 150–440)
RBC: 4.83 MIL/uL (ref 4.40–5.90)
RDW: 12.7 % (ref 11.5–14.5)
WBC: 7.5 10*3/uL (ref 3.8–10.6)

## 2015-10-22 LAB — LIPASE, BLOOD: LIPASE: 27 U/L (ref 11–51)

## 2015-10-22 MED ORDER — OMEPRAZOLE MAGNESIUM 20 MG PO TBEC: 20.0000 mg | DELAYED_RELEASE_TABLET | Freq: Two times a day (BID) | ORAL | Status: DC

## 2015-10-22 MED ORDER — GI COCKTAIL ~~LOC~~
30.0000 mL | Freq: Once | ORAL | Status: AC
  Administered 2015-10-22: 30 mL via ORAL
  Filled 2015-10-22: qty 30

## 2015-10-22 NOTE — ED Provider Notes (Signed)
[facility] Emergency Department Provider Note  ____________________________________________  Time seen: Approximately 4:20 PM  I have reviewed the triage vital signs and the nursing notes.   HISTORY  Chief Complaint Abdominal Pain    HPI Larry Parrish is a 39 y.o. male with history of chronic abdominal pain, hepatitis C, and refractory/recurrent gastritiswho presents today with severe burning epigastric pain that occurred suddenly after eating.  He had an endoscopy yesterday by Dr. Allen Norris which revealed gastritis and esophagitis without any perforation.  He states that he was fine after the procedure but then today he ate some skids and gravy and the pain immediately came back and was more strongly than ever.  It is currently improved.  He has had nausea but no vomiting.  He denies chest pain, other abdominal pain, dysuria, fever/chills.  Onset was acute and it was severe at its worst but now it is mild to moderate.  When he takes a deep breath the pain gets worse but he is not specifically having shortness of breath.   Past Medical History  Diagnosis Date  . History of pancreatitis   . GI bleed   . Hepatitis C     Patient Active Problem List   Diagnosis Date Noted  . Abdominal pain, epigastric   . Reflux esophagitis   . Gastritis   . Abdominal pain, chronic, epigastric 10/13/2015  . Abscess of left arm   . Chronic hepatitis C without hepatic coma ([place])   . Drug abuse   . AA (alcohol abuse) 11/16/2014  . Hepatitis 09/28/2014  . HCV (hepatitis C virus) 08/12/2014  . Intravenous drug user 07/30/2014    Past Surgical History  Procedure Laterality Date  . I&d extremity Left 10/08/2015    Arm  . Hand surgery Left 2008  . Esophagogastroduodenoscopy (egd) with propofol N/A 10/21/2015    Procedure: ESOPHAGOGASTRODUODENOSCOPY (EGD) WITH PROPOFOL;  Surgeon: Lucilla Lame, MD;  Location: [facility];  Service: Endoscopy;  Laterality: N/A;     Current Outpatient Rx  Name  Route  Sig  Dispense  Refill  . cephALEXin (KEFLEX) 500 MG capsule   Oral   Take 1 capsule (500 mg total) by mouth 4 (four) times daily. Patient not taking: Reported on 10/19/2015   40 capsule   0   . omeprazole (PRILOSEC OTC) 20 MG tablet   Oral   Take 1 tablet (20 mg total) by mouth 2 (two) times daily.   60 tablet   1   . sucralfate (CARAFATE) 1 GM/10ML suspension   Oral   Take 10 mLs (1 g total) by mouth 4 (four) times daily. Patient not taking: Reported on 10/19/2015   420 mL   1   . sulfamethoxazole-trimethoprim (BACTRIM DS,SEPTRA DS) 800-160 MG tablet   Oral   Take 2 tablets by mouth 2 (two) times daily. Patient not taking: Reported on 10/19/2015   40 tablet   0   . traMADol (ULTRAM) 50 MG tablet   Oral   Take 50 mg by mouth every 6 (six) hours as needed.      0     Allergies Review of patient's allergies indicates no known allergies.  Family History  Problem Relation Age of Onset  . Stroke Mother   . Heart disease Mother   . Arthritis Mother   . Cancer Father     Pancreatic  . Arthritis Daughter     Rheumatoid    Social History Social History  Substance Use Topics  .  Smoking status: Current Every Day Smoker -- 0.50 packs/day for 15 years    Types: Cigarettes  . Smokeless tobacco: Never Used  . Alcohol Use: Yes     Comment: regular (heavy) ETOH consumption-denies alcohol use x1 week on 10/08/15    Review of Systems Constitutional: No fever/chills Eyes: No visual changes. ENT: No sore throat. Cardiovascular: Denies chest pain. Respiratory: Shortness of breath due to his epigastric pain Gastrointestinal: Severe burning epigastric pain.  nausea, no vomiting.  No diarrhea.  No constipation. Genitourinary: Negative for dysuria. Musculoskeletal: Negative for back pain. Skin: Negative for rash. Neurological: Negative for headaches, focal weakness or numbness.  10-point ROS otherwise  negative.  ____________________________________________   PHYSICAL EXAM:  VITAL SIGNS: ED Triage Vitals  Enc Vitals Group     BP 10/22/15 1534 138/95 mmHg     Pulse Rate 10/22/15 1534 103     Resp 10/22/15 1534 22     Temp 10/22/15 1534 98.5 F (36.9 C)     Temp Source 10/22/15 1534 Oral     SpO2 10/22/15 1534 94 %     Weight 10/22/15 1534 195 lb (88.451 kg)     Height 10/22/15 1534 6\' 1"  (1.854 m)     Head Cir --      Peak Flow --      Pain Score 10/22/15 1535 10     Pain Loc --      Pain Edu? --      Excl. in [place]? --     Constitutional: Alert and oriented. Well appearing and in no acute distress. Eyes: Conjunctivae are normal. PERRL. EOMI. Head: Atraumatic. Nose: No congestion/rhinnorhea. Mouth/Throat: Mucous membranes are moist.  Oropharynx non-erythematous. Neck: No stridor.   Cardiovascular: Normal rate, regular rhythm. Grossly normal heart sounds.  Good peripheral circulation. Respiratory: Normal respiratory effort.  No retractions. Lungs CTAB.  No crepitus upon palpation of the anterior chest wall or neck Gastrointestinal: Soft with very mild tenderness to palpation of the epigastrium. No distention. No abdominal bruits. No CVA tenderness. Musculoskeletal: No lower extremity tenderness nor edema.  No joint effusions. Neurologic:  Normal speech and language. No gross focal neurologic deficits are appreciated.  Skin:  Skin is warm, dry and intact. No rash noted. Psychiatric: Mood and affect are normal. Speech and behavior are normal.  ____________________________________________   LABS (all labs ordered are listed, but only abnormal results are displayed)  Labs Reviewed  COMPREHENSIVE METABOLIC PANEL - Abnormal; Notable for the following:    Glucose, Bld 103 (*)    AST 298 (*)    ALT 486 (*)    All other components within normal limits  URINALYSIS COMPLETEWITH MICROSCOPIC ([facility] ONLY) - Abnormal; Notable for the following:    Color, Urine STRAW (*)     APPearance CLEAR (*)    All other components within normal limits  LIPASE, BLOOD  CBC   ____________________________________________  EKG  Not obtained ____________________________________________  RADIOLOGY   Dg Abd Acute W/chest  10/22/2015  CLINICAL DATA:  Abdominal pain.  Endoscopy done yesterday. EXAM: DG ABDOMEN ACUTE W/ 1V CHEST COMPARISON:  CT abdomen and pelvis 10/05/2015. Ultrasound of the abdomen 10/03/15. FINDINGS: There is no evidence of dilated bowel loops or free intraperitoneal air. No radiopaque calculi or other significant radiographic abnormality is seen. Heart size and mediastinal contours are within normal limits. Both lungs are clear. Multiple right-sided rib fractures are stable. IMPRESSION: Negative abdominal radiographs.  No acute cardiopulmonary disease. Electronically Signed   By: Staci Righter  M.D.   On: 10/22/2015 16:33    ____________________________________________   PROCEDURES  Procedure(s) performed: None  Critical Care performed: No ____________________________________________   INITIAL IMPRESSION / ASSESSMENT AND PLAN / ED COURSE  Pertinent labs & imaging results that were available during my care of the patient were reviewed by me and considered in my medical decision making (see chart for details).  The patient is well-appearing at this time.  He has no evidence of esophageal perforation on exam or on plain films.  This pain is improved I had a long discussion with him about gastritis and esophagitis and how he needs to moderate his diet to accommodate these diagnoses.  I also explained that he needs to continue taking the Carafate and Prilosec use previously described.  He understands and agrees with my recommendations and will follow up with GI.  I gave my usual and customary return precautions.     ____________________________________________  FINAL CLINICAL IMPRESSION(S) / ED DIAGNOSES  Final diagnoses:  Chronic abdominal pain   Esophagitis determined by endoscopy  Gastritis determined by endoscopy      NEW MEDICATIONS STARTED DURING THIS VISIT:  Discharge Medication List as of 10/22/2015  5:30 PM    START taking these medications   Details  omeprazole (PRILOSEC OTC) 20 MG tablet Take 1 tablet (20 mg total) by mouth 2 (two) times daily., Starting 10/22/2015, Until Sat 10/21/16, Print         Hinda Kehr, MD 10/23/15 [phone]

## 2015-10-22 NOTE — Discharge Instructions (Signed)
You have been seen in the Emergency Department (ED) for abdominal pain.  Your symptoms are caused by your persistent gastritis and esophagitis.  Please remember to take your Carafate before he even a meal and your other medications afterwards.  We included a prescription for Prilosec which we recommend that you take once in the morning and once in the evening.  Please follow up as instructed above regarding todays emergent visit and the symptoms that are bothering you.  Return to the ED if your abdominal pain worsens or fails to improve, you develop bloody vomiting, bloody diarrhea, you are unable to tolerate fluids due to vomiting, fever greater than 101, or other symptoms that concern you.   Chronic Pain Chronic pain can be defined as pain that is off and on and lasts for 3-6 months or longer. Many things cause chronic pain, which can make it difficult to make a diagnosis. There are many treatment options available for chronic pain. However, finding a treatment that works well for you may require trying various approaches until the right one is found. Many people benefit from a combination of two or more types of treatment to control their pain. SYMPTOMS  Chronic pain can occur anywhere in the body and can range from mild to very severe. Some types of chronic pain include:  Headache.  Low back pain.  Cancer pain.  Arthritis pain.  Neurogenic pain. This is pain resulting from damage to nerves. People with chronic pain may also have other symptoms such as:  Depression.  Anger.  Insomnia.  Anxiety. DIAGNOSIS  Your health care provider will help diagnose your condition over time. In many cases, the initial focus will be on excluding possible conditions that could be causing the pain. Depending on your symptoms, your health care provider may order tests to diagnose your condition. Some of these tests may include:   Blood tests.   CT scan.   MRI.   X-rays.   Ultrasounds.    Nerve conduction studies.  You may need to see a specialist.  TREATMENT  Finding treatment that works well may take time. You may be referred to a pain specialist. He or she may prescribe medicine or therapies, such as:   Mindful meditation or yoga.  Shots (injections) of numbing or pain-relieving medicines into the spine or area of pain.  Local electrical stimulation.  Acupuncture.   Massage therapy.   Aroma, color, light, or sound therapy.   Biofeedback.   Working with a physical therapist to keep from getting stiff.   Regular, gentle exercise.   Cognitive or behavioral therapy.   Group support.  Sometimes, surgery may be recommended.  HOME CARE INSTRUCTIONS   Take all medicines as directed by your health care provider.   Lessen stress in your life by relaxing and doing things such as listening to calming music.   Exercise or be active as directed by your health care provider.   Eat a healthy diet and include things such as vegetables, fruits, fish, and lean meats in your diet.   Keep all follow-up appointments with your health care provider.   Attend a support group with others suffering from chronic pain. SEEK MEDICAL CARE IF:   Your pain gets worse.   You develop a new pain that was not there before.   You cannot tolerate medicines given to you by your health care provider.   You have new symptoms since your last visit with your health care provider.  [place]  CARE IF:   You feel weak.   You have decreased sensation or numbness.   You lose control of bowel or bladder function.   Your pain suddenly gets much worse.   You develop shaking.  You develop chills.  You develop confusion.  You develop chest pain.  You develop shortness of breath.  MAKE SURE YOU:  Understand these instructions.  Will watch your condition.  Will get help right away if you are not doing well or get worse.   This  information is not intended to replace advice given to you by your health care provider. Make sure you discuss any questions you have with your health care provider.   Document Released: 07/22/2002 Document Revised: 07/02/2013 Document Reviewed: 04/25/2013 Elsevier Interactive Patient Education 2016 Elsevier Inc.  Esophagitis Esophagitis is inflammation of the esophagus. The esophagus is the tube that carries food and liquids from your mouth to your stomach. Esophagitis can cause soreness or pain in the esophagus. This condition can make it difficult and painful to swallow.  CAUSES Most causes of esophagitis are not serious. Common causes of this condition include:  Gastroesophageal reflux disease (GERD). This is when stomach contents move back up into the esophagus (reflux).  Repeated vomiting.  An allergic-type reaction, especially caused by food allergies (eosinophilic esophagitis).  Injury to the esophagus by swallowing large pills with or without water, or swallowing certain types of medicines.  Swallowing (ingesting) harmful chemicals, such as household cleaning products.  Heavy alcohol use.  An infection of the esophagus.This most often occurs in people who have a weakened immune system.  Radiation or chemotherapy treatment for cancer.  Certain diseases such as sarcoidosis, Crohn disease, and scleroderma. SYMPTOMS Symptoms of this condition include:  Difficult or painful swallowing.  Pain with swallowing acidic liquids, such as citrus juices.  Pain with burping.  Chest pain.  Difficulty breathing.  Nausea.  Vomiting.  Pain in the abdomen.  Weight loss.  Ulcers in the mouth.  Patches of white material in the mouth (candidiasis).  Fever.  Coughing up blood or vomiting blood.  Stool that is black, tarry, or bright red. DIAGNOSIS Your health care provider will take a medical history and perform a physical exam. You may also have other tests,  including:  An endoscopy to examine your stomach and esophagus with a small camera.  A test that measures the acidity level in your esophagus.  A test that measures how much pressure is on your esophagus.  A barium swallow or modified barium swallow to show the shape, size, and functioning of your esophagus.  Allergy tests. TREATMENT Treatment for this condition depends on the cause of your esophagitis. In some cases, steroids or other medicines may be given to help relieve your symptoms or to treat the underlying cause of your condition. You may have to make some lifestyle changes, such as:  Avoiding alcohol.  Quitting smoking.  Changing your diet.  Exercising.  Changing your sleep habits and your sleep environment. HOME CARE INSTRUCTIONS Take these actions to decrease your discomfort and to help avoid complications. Diet  Follow a diet as recommended by your health care provider. This may involve avoiding foods and drinks such as:  Coffee and tea (with or without caffeine).  Drinks that contain alcohol.  Energy drinks and sports drinks.  Carbonated drinks or sodas.  Chocolate and cocoa.  Peppermint and mint flavorings.  Garlic and onions.  Horseradish.  Spicy and acidic foods, including peppers, chili powder, curry powder, vinegar,  hot sauces, and barbecue sauce.  Citrus fruit juices and citrus fruits, such as oranges, lemons, and limes.  Tomato-based foods, such as red sauce, chili, salsa, and pizza with red sauce.  Fried and fatty foods, such as donuts, french fries, potato chips, and high-fat dressings.  High-fat meats, such as hot dogs and fatty cuts of red and white meats, such as rib eye steak, sausage, ham, and bacon.  High-fat dairy items, such as whole milk, butter, and cream cheese.  Eat small, frequent meals instead of large meals.  Avoid drinking large amounts of liquid with your meals.  Avoid eating meals during the 2-3 hours before  bedtime.  Avoid lying down right after you eat.  Do not exercise right after you eat.  Avoid foods and drinks that seem to make your symptoms worse. General Instructions  Pay attention to any changes in your symptoms.  Take over-the-counter and prescription medicines only as told by your health care provider. Do not take aspirin, ibuprofen, or other NSAIDs unless your health care provider told you to do so.  If you have trouble taking pills, use a pill splitter to decrease the size of the pill. This will decrease the chance of the pill getting stuck or injuring your esophagus on the way down. Also, drink water after you take a pill.  Do not use any tobacco products, including cigarettes, chewing tobacco, and e-cigarettes. If you need help quitting, ask your health care provider.  Wear loose-fitting clothing. Do not wear anything tight around your waist that causes pressure on your abdomen.  Raise (elevate) the head of your bed about 6 inches (15 cm).  Try to reduce your stress, such as with yoga or meditation. If you need help reducing stress, ask your health care provider.  If you are overweight, reduce your weight to an amount that is healthy for you. Ask your health care provider for guidance about a safe weight loss goal.  Keep all follow-up visits as told by your health care provider. This is important. SEEK MEDICAL CARE IF:  You have new symptoms.  You have unexplained weight loss.  You have difficulty swallowing, or it hurts to swallow.  You have wheezing or a persistent cough.  Your symptoms do not improve with treatment.  You have frequent heartburn for more than two weeks. SEEK IMMEDIATE MEDICAL CARE IF:  You have severe pain in your arms, neck, jaw, teeth, or back.  You feel sweaty, dizzy, or light-headed.  You have chest pain or shortness of breath.  You vomit and your vomit looks like blood or coffee grounds.  Your stool is bloody or black.  You have a  fever.  You cannot swallow, drink, or eat.   This information is not intended to replace advice given to you by your health care provider. Make sure you discuss any questions you have with your health care provider.   Document Released: 12/07/2004 Document Revised: 07/21/2015 Document Reviewed: 02/24/2015 Elsevier Interactive Patient Education 2016 Elsevier Inc.  Gastritis, Adult Gastritis is soreness and swelling (inflammation) of the lining of the stomach. Gastritis can develop as a sudden onset (acute) or long-term (chronic) condition. If gastritis is not treated, it can lead to stomach bleeding and ulcers. CAUSES  Gastritis occurs when the stomach lining is weak or damaged. Digestive juices from the stomach then inflame the weakened stomach lining. The stomach lining may be weak or damaged due to viral or bacterial infections. One common bacterial infection is the Helicobacter pylori  infection. Gastritis can also result from excessive alcohol consumption, taking certain medicines, or having too much acid in the stomach.  SYMPTOMS  In some cases, there are no symptoms. When symptoms are present, they may include:  Pain or a burning sensation in the upper abdomen.  Nausea.  Vomiting.  An uncomfortable feeling of fullness after eating. DIAGNOSIS  Your caregiver may suspect you have gastritis based on your symptoms and a physical exam. To determine the cause of your gastritis, your caregiver may perform the following:  Blood or stool tests to check for the H pylori bacterium.  Gastroscopy. A thin, flexible tube (endoscope) is passed down the esophagus and into the stomach. The endoscope has a light and camera on the end. Your caregiver uses the endoscope to view the inside of the stomach.  Taking a tissue sample (biopsy) from the stomach to examine under a microscope. TREATMENT  Depending on the cause of your gastritis, medicines may be prescribed. If you have a bacterial infection,  such as an H pylori infection, antibiotics may be given. If your gastritis is caused by too much acid in the stomach, H2 blockers or antacids may be given. Your caregiver may recommend that you stop taking aspirin, ibuprofen, or other nonsteroidal anti-inflammatory drugs (NSAIDs). HOME CARE INSTRUCTIONS  Only take over-the-counter or prescription medicines as directed by your caregiver.  If you were given antibiotic medicines, take them as directed. Finish them even if you start to feel better.  Drink enough fluids to keep your urine clear or pale yellow.  Avoid foods and drinks that make your symptoms worse, such as:  Caffeine or alcoholic drinks.  Chocolate.  Peppermint or mint flavorings.  Garlic and onions.  Spicy foods.  Citrus fruits, such as oranges, lemons, or limes.  Tomato-based foods such as sauce, chili, salsa, and pizza.  Fried and fatty foods.  Eat small, frequent meals instead of large meals. SEEK IMMEDIATE MEDICAL CARE IF:   You have black or dark red stools.  You vomit blood or material that looks like coffee grounds.  You are unable to keep fluids down.  Your abdominal pain gets worse.  You have a fever.  You do not feel better after 1 week.  You have any other questions or concerns. MAKE SURE YOU:  Understand these instructions.  Will watch your condition.  Will get help right away if you are not doing well or get worse.   This information is not intended to replace advice given to you by your health care provider. Make sure you discuss any questions you have with your health care provider.   Document Released: 10/24/2001 Document Revised: 04/30/2012 Document Reviewed: 12/13/2011 Elsevier Interactive Patient Education [employer].

## 2015-10-22 NOTE — ED Notes (Addendum)
Pt took Prilosec this morning with no relief. Prior to that hasn't  taken anything since Wed in prep for endoscopy. States he can't take a deep breath. When he does the pain shoots to his back and is unbearable. Pt is also positive for Hep C.

## 2015-10-22 NOTE — ED Notes (Addendum)
Developed abd pain after eating today states he endoscopy done yesterday ..did well until eating   States this pain is worse. The pain is mainly to RUQ and radiates into back   Increases with inspiration

## 2015-10-22 NOTE — ED Notes (Signed)
Pt verbalized understanding of discharge instructions. NAD at this time. 

## 2015-10-24 ENCOUNTER — Encounter: Payer: Self-pay | Admitting: Gastroenterology

## 2015-10-25 ENCOUNTER — Ambulatory Visit: Admission: RE | Admit: 2015-10-25 | Payer: Medicaid Other | Source: Ambulatory Visit

## 2015-10-27 ENCOUNTER — Telehealth: Payer: Self-pay

## 2015-10-27 NOTE — Telephone Encounter (Signed)
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 Still waiting for the hepatitis B surface antibody to see if he needs vaccination for hepatitis B

## 2015-10-27 NOTE — Telephone Encounter (Signed)
Pt has been notified of lab results. Waiting on [place] elastography results to start treatment. Pt is aware this is all I need. [place] appt scheduled for 11/02/15.

## 2015-11-02 ENCOUNTER — Telehealth: Payer: Self-pay | Admitting: Surgery

## 2015-11-02 ENCOUNTER — Ambulatory Visit
Admission: RE | Admit: 2015-11-02 | Discharge: 2015-11-02 | Disposition: A | Discharge status: Home / Self Care | Payer: Medicaid Other | Source: Ambulatory Visit | Attending: Gastroenterology | Admitting: Gastroenterology

## 2015-11-02 ENCOUNTER — Ambulatory Visit: Admission: RE | Admit: 2015-11-02 | Payer: Self-pay | Source: Ambulatory Visit

## 2015-11-02 DIAGNOSIS — B1920 Unspecified viral hepatitis C without hepatic coma: Secondary | ICD-10-CM | POA: Insufficient documentation

## 2015-11-02 DIAGNOSIS — B182 Chronic viral hepatitis C: Secondary | ICD-10-CM

## 2015-11-02 NOTE — Telephone Encounter (Signed)
Patient no longer has Family Medicaid. Patient has regular [place] Medicaid. These changes have been applied to patients account/chart. Patient will bring in his new Medicaid card to be scanned into our system once he receives it.

## 2015-11-08 ENCOUNTER — Emergency Department: Payer: Medicaid Other

## 2015-11-08 ENCOUNTER — Emergency Department
Admission: EM | Admit: 2015-11-08 | Discharge: 2015-11-08 | Disposition: A | Discharge status: Home / Self Care | Payer: Medicaid Other | Attending: Emergency Medicine | Admitting: Emergency Medicine

## 2015-11-08 DIAGNOSIS — Y9389 Activity, other specified: Secondary | ICD-10-CM | POA: Insufficient documentation

## 2015-11-08 DIAGNOSIS — S79911A Unspecified injury of right hip, initial encounter: Secondary | ICD-10-CM | POA: Diagnosis not present

## 2015-11-08 DIAGNOSIS — G8911 Acute pain due to trauma: Secondary | ICD-10-CM

## 2015-11-08 DIAGNOSIS — S3992XA Unspecified injury of lower back, initial encounter: Secondary | ICD-10-CM | POA: Insufficient documentation

## 2015-11-08 DIAGNOSIS — Y998 Other external cause status: Secondary | ICD-10-CM | POA: Insufficient documentation

## 2015-11-08 DIAGNOSIS — F17210 Nicotine dependence, cigarettes, uncomplicated: Secondary | ICD-10-CM | POA: Diagnosis not present

## 2015-11-08 DIAGNOSIS — S59901A Unspecified injury of right elbow, initial encounter: Secondary | ICD-10-CM | POA: Diagnosis not present

## 2015-11-08 DIAGNOSIS — Z79899 Other long term (current) drug therapy: Secondary | ICD-10-CM | POA: Diagnosis not present

## 2015-11-08 DIAGNOSIS — M545 Low back pain, unspecified: Secondary | ICD-10-CM

## 2015-11-08 DIAGNOSIS — W1839XA Other fall on same level, initial encounter: Secondary | ICD-10-CM | POA: Diagnosis not present

## 2015-11-08 DIAGNOSIS — Y9289 Other specified places as the place of occurrence of the external cause: Secondary | ICD-10-CM | POA: Insufficient documentation

## 2015-11-08 LAB — URINALYSIS COMPLETE WITH MICROSCOPIC (ARMC ONLY)
BACTERIA UA: NONE SEEN
BILIRUBIN URINE: NEGATIVE
GLUCOSE, UA: NEGATIVE mg/dL
HGB URINE DIPSTICK: NEGATIVE
KETONES UR: NEGATIVE mg/dL
LEUKOCYTES UA: NEGATIVE
NITRITE: NEGATIVE
PH: 8 (ref 5.0–8.0)
Protein, ur: NEGATIVE mg/dL
SPECIFIC GRAVITY, URINE: 1.01 (ref 1.005–1.030)
Squamous Epithelial / LPF: NONE SEEN

## 2015-11-08 MED ORDER — KETOROLAC TROMETHAMINE 60 MG/2ML IM SOLN
60.0000 mg | Freq: Once | INTRAMUSCULAR | Status: AC
  Administered 2015-11-08: 60 mg via INTRAMUSCULAR
  Filled 2015-11-08: qty 2

## 2015-11-08 MED ORDER — TIZANIDINE HCL 4 MG PO TABS: 4.0000 mg | ORAL_TABLET | Freq: Three times a day (TID) | ORAL | Status: DC | PRN

## 2015-11-08 MED ORDER — NAPROXEN 500 MG PO TABS: 500.0000 mg | ORAL_TABLET | Freq: Two times a day (BID) | ORAL | Status: DC

## 2015-11-08 MED ORDER — OXYCODONE HCL 5 MG PO TABS: 5.0000 mg | ORAL_TABLET | Freq: Three times a day (TID) | ORAL | Status: DC | PRN

## 2015-11-08 MED ORDER — HYDROMORPHONE HCL 1 MG/ML IJ SOLN
INTRAMUSCULAR | Status: AC
  Filled 2015-11-08: qty 1

## 2015-11-08 MED ORDER — OXYCODONE HCL 5 MG PO TABS
10.0000 mg | ORAL_TABLET | Freq: Once | ORAL | Status: AC
  Administered 2015-11-08: 10 mg via ORAL
  Filled 2015-11-08: qty 2

## 2015-11-08 MED ORDER — HYDROMORPHONE HCL 1 MG/ML IJ SOLN
1.0000 mg | Freq: Once | INTRAMUSCULAR | Status: AC
Start: 2015-11-08 — End: 2015-11-08
  Administered 2015-11-08: 1 mg via INTRAMUSCULAR

## 2015-11-08 NOTE — ED Notes (Signed)
Pt states he was playing on a "rip stick" yesterday and fell .Marland Kitchen Pt c/o pain in the lumbar area, states it was ok yesterday but he laid around and today pain worsened.Marland Kitchen

## 2015-11-08 NOTE — ED Notes (Signed)
Pt has lower back pain.  Injured yesterday on a rip stick.  States pain worse today.  States pain radiates into both legs.

## 2015-11-08 NOTE — ED Provider Notes (Signed)
[facility] Emergency Department Provider Note ____________________________________________  Time seen: Approximately 10:07 PM  I have reviewed the triage vital signs and the nursing notes.   HISTORY  Chief Complaint Back Pain   HPI Larry Parrish is a 39 y.o. male who presents to the emergency department for evaluation of right hip pain and lower back pain after a fall. He was attempting to ride a ripstick yesterday and landed on his right elbow, lower back and hip. Pain has increased over the past 24 hours and he is now having trouble moving around.He has not taken anything for pain.   Past Medical History  Diagnosis Date  . History of pancreatitis   . GI bleed   . Hepatitis C     Patient Active Problem List   Diagnosis Date Noted  . Abdominal pain, epigastric   . Reflux esophagitis   . Gastritis   . Abdominal pain, chronic, epigastric 10/13/2015  . Abscess of left arm   . Chronic hepatitis C without hepatic coma ([place])   . Drug abuse   . AA (alcohol abuse) 11/16/2014  . Hepatitis 09/28/2014  . HCV (hepatitis C virus) 08/12/2014  . Intravenous drug user 07/30/2014    Past Surgical History  Procedure Laterality Date  . I&d extremity Left 10/08/2015    Arm  . Hand surgery Left 2008  . Esophagogastroduodenoscopy (egd) with propofol N/A 10/21/2015    Procedure: ESOPHAGOGASTRODUODENOSCOPY (EGD) WITH PROPOFOL;  Surgeon: Lucilla Lame, MD;  Location: [place];  Service: Endoscopy;  Laterality: N/A;    Current Outpatient Rx  Name  Route  Sig  Dispense  Refill  . cephALEXin (KEFLEX) 500 MG capsule   Oral   Take 1 capsule (500 mg total) by mouth 4 (four) times daily. Patient not taking: Reported on 10/19/2015   40 capsule   0   . naproxen (NAPROSYN) 500 MG tablet   Oral   Take 1 tablet (500 mg total) by mouth 2 (two) times daily with a meal.   60 tablet   0   . omeprazole (PRILOSEC OTC) 20 MG tablet   Oral   Take 1 tablet (20 mg  total) by mouth 2 (two) times daily.   60 tablet   1   . oxyCODONE (ROXICODONE) 5 MG immediate release tablet   Oral   Take 1 tablet (5 mg total) by mouth every 8 (eight) hours as needed.   12 tablet   0   . sucralfate (CARAFATE) 1 GM/10ML suspension   Oral   Take 10 mLs (1 g total) by mouth 4 (four) times daily. Patient not taking: Reported on 10/19/2015   420 mL   1   . sulfamethoxazole-trimethoprim (BACTRIM DS,SEPTRA DS) 800-160 MG tablet   Oral   Take 2 tablets by mouth 2 (two) times daily. Patient not taking: Reported on 10/19/2015   40 tablet   0   . tiZANidine (ZANAFLEX) 4 MG tablet   Oral   Take 1 tablet (4 mg total) by mouth every 8 (eight) hours as needed for muscle spasms.   30 tablet   0   . traMADol (ULTRAM) 50 MG tablet   Oral   Take 50 mg by mouth every 6 (six) hours as needed.      0     Allergies Review of patient's allergies indicates no known allergies.  Family History  Problem Relation Age of Onset  . Stroke Mother   . Heart disease Mother   .  Arthritis Mother   . Cancer Father     Pancreatic  . Arthritis Daughter     Rheumatoid    Social History Social History  Substance Use Topics  . Smoking status: Current Every Day Smoker -- 0.50 packs/day for 15 years    Types: Cigarettes  . Smokeless tobacco: Never Used  . Alcohol Use: Yes     Comment: regular (heavy) ETOH consumption-denies alcohol use x1 week on 10/08/15    Review of Systems Constitutional: No recent illness. Eyes: No visual changes. ENT: No sore throat. Cardiovascular: Denies chest pain or palpitations. Respiratory: Denies shortness of breath. Gastrointestinal: No abdominal pain.  Genitourinary: Negative for dysuria. Musculoskeletal: Pain in right elbow, hip, and lower back. Negative for loss of bowel or bladder control. Skin: Negative for rash. Neurological: Negative for headaches, focal weakness or numbness. 10-point ROS otherwise  negative.  ____________________________________________   PHYSICAL EXAM:  VITAL SIGNS: ED Triage Vitals  Enc Vitals Group     BP 11/08/15 1640 136/80 mmHg     Pulse Rate 11/08/15 1640 85     Resp 11/08/15 1640 18     Temp 11/08/15 1640 97.5 F (36.4 C)     Temp Source 11/08/15 1640 Oral     SpO2 11/08/15 1640 97 %     Weight 11/08/15 1640 210 lb (95.255 kg)     Height 11/08/15 1640 6\' 1"  (1.854 m)     Head Cir --      Peak Flow --      Pain Score 11/08/15 1641 10     Pain Loc --      Pain Edu? --      Excl. in [place]? --     Constitutional: Alert and oriented. Well appearing and in no acute distress. Eyes: Conjunctivae are normal. EOMI. Head: Atraumatic. Nose: No congestion/rhinnorhea. Neck: No stridor.  Respiratory: Normal respiratory effort.   Musculoskeletal:Midline tenderness to the lower lumbar area Neurologic:  Normal speech and language. No gross focal neurologic deficits are appreciated. Speech is normal. No gait instability. Skin:  Skin is warm, dry and intact. Atraumatic. Psychiatric: Mood and affect are normal. Speech and behavior are normal.  ____________________________________________   LABS (all labs ordered are listed, but only abnormal results are displayed)  Labs Reviewed  URINALYSIS COMPLETEWITH MICROSCOPIC ([place]) - Abnormal; Notable for the following:    Color, Urine STRAW (*)    APPearance CLEAR (*)    All other components within normal limits   ____________________________________________  RADIOLOGY  Lumbar Spine negative for lumbar spine injury per radiology. Right hip negative for acute fracture or dislocation per radiology. ____________________________________________   PROCEDURES  Procedure(s) performed: None   ____________________________________________   INITIAL IMPRESSION / ASSESSMENT AND PLAN / ED COURSE  Pertinent labs & imaging results that were available during my care of the patient were reviewed by me and  considered in my medical decision making (see chart for details).  Follow up with the PCP/orthopedist for symptoms that are not improving over the week.  Return to the ER for symptoms that change or worsen if unable to schedule an appointment.  ____________________________________________   FINAL CLINICAL IMPRESSION(S) / ED DIAGNOSES  Final diagnoses:  Acute lumbar back pain  Acute pain due to injury       Victorino Dike, FNP 11/08/15 2216  Ahmed Prima, MD 11/08/15 2337

## 2015-11-08 NOTE — Discharge Instructions (Signed)

## 2015-11-10 ENCOUNTER — Telehealth: Payer: Self-pay

## 2015-11-10 NOTE — Telephone Encounter (Signed)
-----   Message from Lucilla Lame, MD sent at 11/03/2015  8:50 AM EST ----- Let the patient know that his elasticity test showed fibrosis score of F0 to F1

## 2015-11-10 NOTE — Telephone Encounter (Signed)
LVM for pt to return my call regarding his [place] results. Advised pt I will be sending his paperwork in to get approval for Hep C treatment.

## 2015-11-12 LAB — IGG, IGA, IGM
IGA: 150 mg/dL (ref 90–386)
IGM, SERUM: 149 mg/dL (ref 20–172)
IgG (Immunoglobin G), Serum: 1197 mg/dL (ref 700–1600)

## 2015-11-12 LAB — HEPATITIS C ANTIBODY: HCV Ab: >11 s/co ratio — ABNORMAL HIGH (ref 0.0–0.9)

## 2015-11-12 LAB — ALPHA-1 ANTITRYPSIN PHENOTYPE: A1 ANTITRYPSIN SER: 141 mg/dL (ref 90–200)

## 2015-11-12 LAB — MITOCHONDRIAL ANTIBODIES: MITOCHONDRIAL M2 AB, IGG: 8 U (ref 0.0–20.0)

## 2015-11-12 LAB — HEPATITIS B SURFACE ANTIBODY, QUANTITATIVE: Hepatitis B-Post: 12.1 m[IU]/mL (ref >9.9)

## 2015-11-12 LAB — HEPATITIS C GENOTYPE: HCV Genotype: 3

## 2015-11-12 LAB — ANTI-SMOOTH MUSCLE ANTIBODY, IGG: F-ACTIN AB IGG: 19 U (ref 0–19)

## 2015-11-12 LAB — CERULOPLASMIN: CERULOPLASMIN: 40.3 mg/dL — AB (ref 16.0–31.0)

## 2015-11-12 LAB — HEPATITIS A ANTIBODY, TOTAL: Hep A Total Ab: POSITIVE — AB

## 2015-11-17 ENCOUNTER — Encounter: Payer: Self-pay | Admitting: Gastroenterology

## 2015-11-17 LAB — HCV RT-PCR, QUANT (NON-GRAPH)

## 2015-12-17 ENCOUNTER — Other Ambulatory Visit: Payer: Self-pay

## 2015-12-17 DIAGNOSIS — K2100 Gastro-esophageal reflux disease with esophagitis, without bleeding: Secondary | ICD-10-CM

## 2015-12-17 MED ORDER — OMEPRAZOLE 40 MG PO CPDR: 40.0000 mg | DELAYED_RELEASE_CAPSULE | Freq: Every day | ORAL | Status: DC

## 2015-12-17 MED ORDER — SUCRALFATE 1 GM/10ML PO SUSP: 1.0000 g | Freq: Four times a day (QID) | ORAL | Status: DC

## 2015-12-22 ENCOUNTER — Other Ambulatory Visit: Payer: Self-pay

## 2015-12-22 ENCOUNTER — Encounter: Payer: Self-pay | Admitting: Gastroenterology

## 2015-12-22 ENCOUNTER — Ambulatory Visit (INDEPENDENT_AMBULATORY_CARE_PROVIDER_SITE_OTHER): Payer: Medicaid Other | Admitting: Gastroenterology

## 2015-12-22 VITALS — BP 124/79 | HR 72 | Temp 98.8°F | Ht 73.0 in | Wt 202.0 lb

## 2015-12-22 DIAGNOSIS — B182 Chronic viral hepatitis C: Secondary | ICD-10-CM

## 2015-12-22 NOTE — Progress Notes (Signed)
Primary Care Physician: Pcp Not In System  Primary Gastroenterologist:  Dr. Lucilla Lame  Chief Complaint  Patient presents with  . Follow up Hepatitis C    HPI: Larry Parrish is a 40 y.o. male here for follow-up of his hepatitis C. The patient has not been treated for his hepatitis C because his insurance company deemed his fibrosis score to be low. The patient is now coming here prior to going on a 5-7 months hiking trip to the [place] area the patient is concerned that he may not have access to medical care.  Current Outpatient Prescriptions  Medication Sig Dispense Refill  . omeprazole (PRILOSEC) 40 MG capsule Take 1 capsule (40 mg total) by mouth daily. 30 capsule 11  . sucralfate (CARAFATE) 1 GM/10ML suspension Take 10 mLs (1 g total) by mouth 4 (four) times daily. 420 mL 5  . cephALEXin (KEFLEX) 500 MG capsule Take 1 capsule (500 mg total) by mouth 4 (four) times daily. (Patient not taking: Reported on 10/19/2015) 40 capsule 0  . naproxen (NAPROSYN) 500 MG tablet Take 1 tablet (500 mg total) by mouth 2 (two) times daily with a meal. (Patient not taking: Reported on 12/22/2015) 60 tablet 0  . omeprazole (PRILOSEC OTC) 20 MG tablet Take 1 tablet (20 mg total) by mouth 2 (two) times daily. (Patient not taking: Reported on 12/22/2015) 60 tablet 1  . oxyCODONE (ROXICODONE) 5 MG immediate release tablet Take 1 tablet (5 mg total) by mouth every 8 (eight) hours as needed. (Patient not taking: Reported on 12/22/2015) 12 tablet 0  . sulfamethoxazole-trimethoprim (BACTRIM DS,SEPTRA DS) 800-160 MG tablet Take 2 tablets by mouth 2 (two) times daily. (Patient not taking: Reported on 10/19/2015) 40 tablet 0  . tiZANidine (ZANAFLEX) 4 MG tablet Take 1 tablet (4 mg total) by mouth every 8 (eight) hours as needed for muscle spasms. (Patient not taking: Reported on 12/22/2015) 30 tablet 0  . traMADol (ULTRAM) 50 MG tablet Take 50 mg by mouth every 6 (six) hours as needed. Reported on 12/22/2015  0  .  [DISCONTINUED] lansoprazole (PREVACID) 30 MG capsule Take 30 mg by mouth daily at 12 noon.    . [DISCONTINUED] pantoprazole (PROTONIX) 40 MG tablet Take 1 tablet (40 mg total) by mouth 2 (two) times daily. (Patient not taking: Reported on 10/19/2015) 60 tablet 1   No current facility-administered medications for this visit.    Allergies as of 12/22/2015  . (No Known Allergies)    ROS:  General: Negative for anorexia, weight loss, fever, chills, fatigue, weakness. ENT: Negative for hoarseness, difficulty swallowing , nasal congestion. CV: Negative for chest pain, angina, palpitations, dyspnea on exertion, peripheral edema.  Respiratory: Negative for dyspnea at rest, dyspnea on exertion, cough, sputum, wheezing.  GI: See history of present illness. GU:  Negative for dysuria, hematuria, urinary incontinence, urinary frequency, nocturnal urination.  Endo: Negative for unusual weight change.    Physical Examination:   BP 124/79 mmHg  Pulse 72  Temp(Src) 98.8 F (37.1 C) (Oral)  Ht 6\' 1"  (1.854 m)  Wt 202 lb (91.627 kg)  BMI 26.66 kg/m2  General: Well-nourished, well-developed in no acute distress.  Skin: Warm and dry, no jaundice.   Psych: Alert and cooperative, normal mood and affect.  Labs:    Imaging Studies: No results found.  Assessment and Plan:   Larry Parrish is a 40 y.o. y/o male who comes in with a history of hepatitis C. The patient has been told that  due to his low fibrosis score there is very little concerned about him not being treated for his hepatitis C while he is on his trip for the next 5-7 months. The patient should have already received his hepatitis A vaccination and if not then he will need that prior to going on trip.  Note: This dictation was prepared with Dragon dictation along with smaller phrase technology. Any transcriptional errors that result from this process are unintentional.

## 2016-01-30 ENCOUNTER — Encounter: Payer: Self-pay | Admitting: *Deleted

## 2016-01-30 ENCOUNTER — Emergency Department
Admission: EM | Admit: 2016-01-30 | Discharge: 2016-01-30 | Disposition: A | Discharge status: Home / Self Care | Payer: Medicaid Other | Attending: Emergency Medicine | Admitting: Emergency Medicine

## 2016-01-30 DIAGNOSIS — J0100 Acute maxillary sinusitis, unspecified: Secondary | ICD-10-CM

## 2016-01-30 DIAGNOSIS — Z79899 Other long term (current) drug therapy: Secondary | ICD-10-CM | POA: Diagnosis not present

## 2016-01-30 DIAGNOSIS — F17210 Nicotine dependence, cigarettes, uncomplicated: Secondary | ICD-10-CM | POA: Insufficient documentation

## 2016-01-30 DIAGNOSIS — R51 Headache: Secondary | ICD-10-CM | POA: Diagnosis present

## 2016-01-30 MED ORDER — AMOXICILLIN-POT CLAVULANATE 875-125 MG PO TABS: 1.0000 | ORAL_TABLET | Freq: Two times a day (BID) | ORAL | Status: AC

## 2016-01-30 NOTE — ED Provider Notes (Signed)
[facility] Emergency Department Provider Note  ____________________________________________  Time seen: On arrival  I have reviewed the triage vital signs and the nursing notes.   HISTORY  Chief Complaint Facial Pain    HPI Larry Parrish is a 40 y.o. male who presents with complaints of left sinus pressure. Patient reports he had a tooth extracted approximately 2-1/2 weeks ago and may have had penetration of his maxillary sinus during the procedure. His dentist did pack the area and sutured closed. He did take a course of antibiotics afterwards. Had been doing well except now he has developed pressure in his leftmaxillary sinus area similar to how he fell before. No fevers or chills. No nausea or vomiting. No intraoral swelling    Past Medical History  Diagnosis Date  . History of pancreatitis   . GI bleed   . Hepatitis C     Patient Active Problem List   Diagnosis Date Noted  . Abdominal pain, epigastric   . Reflux esophagitis   . Gastritis   . Abdominal pain, chronic, epigastric 10/13/2015  . Abscess of left arm   . Chronic hepatitis C without hepatic coma ([place])   . Drug abuse   . AA (alcohol abuse) 11/16/2014  . Hepatitis 09/28/2014  . HCV (hepatitis C virus) 08/12/2014  . Intravenous drug user 07/30/2014    Past Surgical History  Procedure Laterality Date  . I&d extremity Left 10/08/2015    Arm  . Hand surgery Left 2008  . Esophagogastroduodenoscopy (egd) with propofol N/A 10/21/2015    Procedure: ESOPHAGOGASTRODUODENOSCOPY (EGD) WITH PROPOFOL;  Surgeon: Lucilla Lame, MD;  Location: [facility] [facility];  Service: Endoscopy;  Laterality: N/A;  . Tooth extraction      Current Outpatient Rx  Name  Route  Sig  Dispense  Refill  . amoxicillin-clavulanate (AUGMENTIN) 875-125 MG tablet   Oral   Take 1 tablet by mouth 2 (two) times daily.   14 tablet   0   . cephALEXin (KEFLEX) 500 MG capsule   Oral   Take 1 capsule (500 mg total) by  mouth 4 (four) times daily. Patient not taking: Reported on 10/19/2015   40 capsule   0   . naproxen (NAPROSYN) 500 MG tablet   Oral   Take 1 tablet (500 mg total) by mouth 2 (two) times daily with a meal. Patient not taking: Reported on 12/22/2015   60 tablet   0   . omeprazole (PRILOSEC OTC) 20 MG tablet   Oral   Take 1 tablet (20 mg total) by mouth 2 (two) times daily. Patient not taking: Reported on 12/22/2015   60 tablet   1   . omeprazole (PRILOSEC) 40 MG capsule   Oral   Take 1 capsule (40 mg total) by mouth daily.   30 capsule   11   . oxyCODONE (ROXICODONE) 5 MG immediate release tablet   Oral   Take 1 tablet (5 mg total) by mouth every 8 (eight) hours as needed. Patient not taking: Reported on 12/22/2015   12 tablet   0   . sucralfate (CARAFATE) 1 GM/10ML suspension   Oral   Take 10 mLs (1 g total) by mouth 4 (four) times daily.   420 mL   5   . sulfamethoxazole-trimethoprim (BACTRIM DS,SEPTRA DS) 800-160 MG tablet   Oral   Take 2 tablets by mouth 2 (two) times daily. Patient not taking: Reported on 10/19/2015   40 tablet   0   .  tiZANidine (ZANAFLEX) 4 MG tablet   Oral   Take 1 tablet (4 mg total) by mouth every 8 (eight) hours as needed for muscle spasms. Patient not taking: Reported on 12/22/2015   30 tablet   0   . traMADol (ULTRAM) 50 MG tablet   Oral   Take 50 mg by mouth every 6 (six) hours as needed. Reported on 12/22/2015      0     Allergies Review of patient's allergies indicates no known allergies.  Family History  Problem Relation Age of Onset  . Stroke Mother   . Heart disease Mother   . Arthritis Mother   . Cancer Father     Pancreatic  . Arthritis Daughter     Rheumatoid    Social History Social History  Substance Use Topics  . Smoking status: Current Every Day Smoker -- 0.50 packs/day for 15 years    Types: Cigarettes  . Smokeless tobacco: Never Used  . Alcohol Use: Yes     Comment: regular (heavy) ETOH consumption-denies  alcohol use x1 week on 10/08/15    Review of Systems  Constitutional: Negative for fever. Eyes: Negative for visual changes. ENT: Negative for oral swelling or sore throat    Musculoskeletal: Negative for neck pain Skin: Negative for rash. Neurological: Negative for headaches or focal weakness   ____________________________________________   PHYSICAL EXAM:  VITAL SIGNS: ED Triage Vitals  Enc Vitals Group     BP 01/30/16 1149 135/96 mmHg     Pulse Rate 01/30/16 1149 79     Resp 01/30/16 1149 18     Temp 01/30/16 1149 97.8 F (36.6 C)     Temp Source 01/30/16 1149 Oral     SpO2 01/30/16 1149 99 %     Weight 01/30/16 1149 212 lb 15.4 oz (96.6 kg)     Height --      Head Cir --      Peak Flow --      Pain Score 01/30/16 1152 8     Pain Loc --      Pain Edu? --      Excl. in [place]? --      Constitutional: Alert and oriented. Well appearing and in no distress. Eyes: Conjunctivae are normal.  ENT   Head: Normocephalic and atraumatic. Tenderness over left maxillary sinus, mild   Mouth/Throat: Mucous membranes are moist. No intraoral swelling, pharynx normal Cardiovascular: Normal rate, regular rhythm.  Respiratory: Normal respiratory effort without tachypnea nor retractions.    Neurologic:  Normal speech and language. No gross focal neurologic deficits are appreciated. Skin:  Skin is warm, dry and intact. No rash noted. Psychiatric: Mood and affect are normal. Patient exhibits appropriate insight and judgment.  ____________________________________________    LABS (pertinent positives/negatives)  Labs Reviewed - No data to display  ____________________________________________     ____________________________________________    RADIOLOGY I have personally reviewed any xrays that were ordered on this patient: None  ____________________________________________   PROCEDURES  Procedure(s) performed:  none   ____________________________________________   INITIAL IMPRESSION / ASSESSMENT AND PLAN / ED COURSE  Pertinent labs & imaging results that were available during my care of the patient were reviewed by me and considered in my medical decision making (see chart for details).  History of present illness most consistent with sinusitis. We'll Rx Augmentin. Recommend follow-up with his dentist/PCP. Vital signs are normal. Patient is nontoxic with no evidence of intraoral process  ____________________________________________   FINAL CLINICAL IMPRESSION(S) /  ED DIAGNOSES  Final diagnoses:  Acute maxillary sinusitis, recurrence not specified     Lavonia Drafts, MD 01/30/16 1205

## 2016-01-30 NOTE — ED Notes (Addendum)
Patient states he had 1 left upper tooth removed on 01/13/2016. Patient states he went back the same day of procedure for repair due to sinus cavity was breached during the extraction procedure. Patient states his sinus cavity was sutured, packed and then gums were sutured. Patient was placed on antibiotics and finished course of treatment. Patient states pain and pressure returned 5 days ago and has drainage out of his left eye when he lays down. Patient feels pressure behind his left eye.

## 2016-01-30 NOTE — Discharge Instructions (Signed)

## 2016-03-29 ENCOUNTER — Telehealth: Payer: Self-pay | Admitting: Gastroenterology

## 2016-03-29 ENCOUNTER — Other Ambulatory Visit: Payer: Self-pay

## 2016-03-29 ENCOUNTER — Emergency Department
Admission: EM | Admit: 2016-03-29 | Discharge: 2016-03-29 | Disposition: A | Discharge status: Home / Self Care | Payer: Medicaid Other | Attending: Emergency Medicine | Admitting: Emergency Medicine

## 2016-03-29 ENCOUNTER — Encounter: Payer: Self-pay | Admitting: Emergency Medicine

## 2016-03-29 ENCOUNTER — Emergency Department: Payer: Medicaid Other

## 2016-03-29 ENCOUNTER — Other Ambulatory Visit
Admission: RE | Admit: 2016-03-29 | Discharge: 2016-03-29 | Disposition: A | Discharge status: Home / Self Care | Payer: Medicaid Other | Source: Ambulatory Visit | Attending: Gastroenterology | Admitting: Gastroenterology

## 2016-03-29 DIAGNOSIS — F17210 Nicotine dependence, cigarettes, uncomplicated: Secondary | ICD-10-CM | POA: Insufficient documentation

## 2016-03-29 DIAGNOSIS — Z792 Long term (current) use of antibiotics: Secondary | ICD-10-CM | POA: Insufficient documentation

## 2016-03-29 DIAGNOSIS — Z791 Long term (current) use of non-steroidal anti-inflammatories (NSAID): Secondary | ICD-10-CM | POA: Insufficient documentation

## 2016-03-29 DIAGNOSIS — R1032 Left lower quadrant pain: Secondary | ICD-10-CM | POA: Insufficient documentation

## 2016-03-29 DIAGNOSIS — Z79899 Other long term (current) drug therapy: Secondary | ICD-10-CM | POA: Diagnosis not present

## 2016-03-29 DIAGNOSIS — F1490 Cocaine use, unspecified, uncomplicated: Secondary | ICD-10-CM | POA: Insufficient documentation

## 2016-03-29 DIAGNOSIS — R109 Unspecified abdominal pain: Secondary | ICD-10-CM

## 2016-03-29 DIAGNOSIS — R197 Diarrhea, unspecified: Secondary | ICD-10-CM

## 2016-03-29 DIAGNOSIS — F1290 Cannabis use, unspecified, uncomplicated: Secondary | ICD-10-CM | POA: Diagnosis not present

## 2016-03-29 LAB — CBC
HEMATOCRIT: 45.7 % (ref 40.0–52.0)
HEMOGLOBIN: 15.3 g/dL (ref 13.0–18.0)
MCH: 31.6 pg (ref 26.0–34.0)
MCHC: 33.5 g/dL (ref 32.0–36.0)
MCV: 94.2 fL (ref 80.0–100.0)
Platelets: 217 10*3/uL (ref 150–440)
RBC: 4.85 MIL/uL (ref 4.40–5.90)
RDW: 13.2 % (ref 11.5–14.5)
WBC: 4.4 10*3/uL (ref 3.8–10.6)

## 2016-03-29 LAB — COMPREHENSIVE METABOLIC PANEL WITH GFR
ALT: 168 U/L — ABNORMAL HIGH (ref 17–63)
AST: 90 U/L — ABNORMAL HIGH (ref 15–41)
Albumin: 4.5 g/dL (ref 3.5–5.0)
Alkaline Phosphatase: 55 U/L (ref 38–126)
Anion gap: 7 (ref 5–15)
BUN: 15 mg/dL (ref 6–20)
CO2: 26 mmol/L (ref 22–32)
Calcium: 9.4 mg/dL (ref 8.9–10.3)
Chloride: 104 mmol/L (ref 101–111)
Creatinine, Ser: 0.93 mg/dL (ref 0.61–1.24)
GFR calc Af Amer: >60 mL/min
GFR calc non Af Amer: >60 mL/min
Glucose, Bld: 110 mg/dL — ABNORMAL HIGH (ref 65–99)
Potassium: 3.6 mmol/L (ref 3.5–5.1)
Sodium: 137 mmol/L (ref 135–145)
Total Bilirubin: 1.1 mg/dL (ref 0.3–1.2)
Total Protein: 7.7 g/dL (ref 6.5–8.1)

## 2016-03-29 LAB — URINALYSIS COMPLETE WITH MICROSCOPIC (ARMC ONLY)
BACTERIA UA: NONE SEEN
BILIRUBIN URINE: NEGATIVE
GLUCOSE, UA: NEGATIVE mg/dL
HGB URINE DIPSTICK: NEGATIVE
Ketones, ur: NEGATIVE mg/dL
Leukocytes, UA: NEGATIVE
NITRITE: NEGATIVE
PH: 5 (ref 5.0–8.0)
Protein, ur: NEGATIVE mg/dL
RBC / HPF: NONE SEEN RBC/hpf (ref 0–5)
Specific Gravity, Urine: 1.018 (ref 1.005–1.030)

## 2016-03-29 LAB — TROPONIN I

## 2016-03-29 LAB — LIPASE, BLOOD: Lipase: 26 U/L (ref 11–51)

## 2016-03-29 MED ORDER — GI COCKTAIL ~~LOC~~
30.0000 mL | Freq: Once | ORAL | Status: AC
  Administered 2016-03-29: 30 mL via ORAL
  Filled 2016-03-29: qty 30

## 2016-03-29 MED ORDER — TRAMADOL HCL 50 MG PO TABS: 50.0000 mg | ORAL_TABLET | Freq: Four times a day (QID) | ORAL | Status: DC | PRN

## 2016-03-29 MED ORDER — SUCRALFATE 1 G PO TABS: 1.0000 g | ORAL_TABLET | Freq: Four times a day (QID) | ORAL | Status: DC

## 2016-03-29 MED ORDER — SODIUM CHLORIDE 0.9 % IV BOLUS (SEPSIS)
500.0000 mL | Freq: Once | INTRAVENOUS | Status: AC
  Administered 2016-03-29: 500 mL via INTRAVENOUS

## 2016-03-29 MED ORDER — PANTOPRAZOLE SODIUM 40 MG PO TBEC: 40.0000 mg | DELAYED_RELEASE_TABLET | Freq: Two times a day (BID) | ORAL | Status: DC

## 2016-03-29 MED ORDER — TRAMADOL HCL 50 MG PO TABS
50.0000 mg | ORAL_TABLET | Freq: Once | ORAL | Status: AC
  Administered 2016-03-29: 50 mg via ORAL
  Filled 2016-03-29: qty 1

## 2016-03-29 MED ORDER — ONDANSETRON HCL 4 MG PO TABS: 4.0000 mg | ORAL_TABLET | Freq: Three times a day (TID) | ORAL | Status: DC | PRN

## 2016-03-29 MED ORDER — ONDANSETRON HCL 4 MG/2ML IJ SOLN
4.0000 mg | Freq: Once | INTRAMUSCULAR | Status: AC
  Administered 2016-03-29: 4 mg via INTRAVENOUS
  Filled 2016-03-29: qty 2

## 2016-03-29 NOTE — ED Notes (Signed)
Patient transported to CT 

## 2016-03-29 NOTE — Telephone Encounter (Signed)
Patient called and stated that he has been out hiking and is back in town with a few concerns. Please call him today.

## 2016-03-29 NOTE — ED Notes (Addendum)
Pt presents to ED with reports of 30+ pound weight loss over the past 6 weeks. Pt states he has been hiking the [place] trail for two months. Pt reports he is Hepatitis C positive. Pt states for the past three weeks he has not been able to eat much because his abdomen hurts severely on the left side every time he eats. Pt denies diarrhea but states his stools have been mushy for the past three weeks. Pt reports he has had several episodes of feeling like he was going to pass out.

## 2016-03-29 NOTE — ED Notes (Signed)
Pt in via triage with complaints of 30 pound weight loss over the last three weeks.  Pt reports hiking the [place] for 3 months; intermittent LUQ pain with projectile vomiting and "high fevers."  Pt GI doctor suggest he be seen here.  Pt in the process of providing stool samples to GI specialist to test for possible parasites.  Pt A/Ox4, no immediate distress at this time.

## 2016-03-29 NOTE — Telephone Encounter (Signed)
Spoke with pt regarding his symptoms. Pt is currently having diarrhea and vomiting after eating. Pt feels like there is a ball in his stomach. Pt has been walking the [employer] for 2 months. Ordered GI Profile and ova and Parasite stool test to check and make sure he has not contracted a bacteria or parasite while in the woods. Pt will go today as he will be leaving to go back next Thursday.

## 2016-03-29 NOTE — Discharge Instructions (Signed)

## 2016-03-29 NOTE — ED Provider Notes (Signed)
Time Seen: Approximately 2015 I have reviewed the triage notes  Chief Complaint: Abdominal Pain and Weight Loss   History of Present Illness: Larry Parrish is a 40 y.o. male who states he's been hiking on the [place] trail for the last several weeks. The patient has a history of gastrointestinal disease with a history of hepatitis C. He states he was a previous IV drug abuser but is not used any recent illicit drugs and is actually hiking the tramadol to develop awareness for hepatitis C. He does have a local gastroenterologist and apparently had an upper lower GI exam prior to his track of the trip. Date studies been having some increased abdominal pain recently and is complaining of some epigastric pain and left lower quadrant discomfort usually associated with meals. States he's had some periodic vomiting with decreased food and fluid intake and is noticed a weight loss of approximately 30 pounds over the past several weeks. He states his pain is almost exclusively to food and fluids. He was previously on Carafate and has continued to be on a proton pump inhibitor. He tried to arrange follow-up with his gastroenterologist office and has a sample that is picked up for stool sample for possible infectious source. He denies any diarrhea or persistent vomiting. He is not aware of any fever or melena or hematochezia Past Medical History  Diagnosis Date  . History of pancreatitis   . GI bleed   . Hepatitis C     Patient Active Problem List   Diagnosis Date Noted  . Abdominal pain, epigastric   . Reflux esophagitis   . Gastritis   . Abdominal pain, chronic, epigastric 10/13/2015  . Abscess of left arm   . Chronic hepatitis C without hepatic coma ([place])   . Drug abuse   . AA (alcohol abuse) 11/16/2014  . Hepatitis 09/28/2014  . HCV (hepatitis C virus) 08/12/2014  . Intravenous drug user 07/30/2014    Past Surgical History  Procedure Laterality Date  . I&d extremity Left 10/08/2015     Arm  . Hand surgery Left 2008  . Esophagogastroduodenoscopy (egd) with propofol N/A 10/21/2015    Procedure: ESOPHAGOGASTRODUODENOSCOPY (EGD) WITH PROPOFOL;  Surgeon: Lucilla Lame, MD;  Location: [facility];  Service: Endoscopy;  Laterality: N/A;  . Tooth extraction      Past Surgical History  Procedure Laterality Date  . I&d extremity Left 10/08/2015    Arm  . Hand surgery Left 2008  . Esophagogastroduodenoscopy (egd) with propofol N/A 10/21/2015    Procedure: ESOPHAGOGASTRODUODENOSCOPY (EGD) WITH PROPOFOL;  Surgeon: Lucilla Lame, MD;  Location: [facility];  Service: Endoscopy;  Laterality: N/A;  . Tooth extraction      Current Outpatient Rx  Name  Route  Sig  Dispense  Refill  . cephALEXin (KEFLEX) 500 MG capsule   Oral   Take 1 capsule (500 mg total) by mouth 4 (four) times daily. Patient not taking: Reported on 10/19/2015   40 capsule   0   . naproxen (NAPROSYN) 500 MG tablet   Oral   Take 1 tablet (500 mg total) by mouth 2 (two) times daily with a meal. Patient not taking: Reported on 12/22/2015   60 tablet   0   . omeprazole (PRILOSEC OTC) 20 MG tablet   Oral   Take 1 tablet (20 mg total) by mouth 2 (two) times daily. Patient not taking: Reported on 12/22/2015   60 tablet   1   . omeprazole (PRILOSEC) 40  MG capsule   Oral   Take 1 capsule (40 mg total) by mouth daily.   30 capsule   11   . ondansetron (ZOFRAN) 4 MG tablet   Oral   Take 1 tablet (4 mg total) by mouth every 8 (eight) hours as needed for nausea or vomiting.   21 tablet   0   . oxyCODONE (ROXICODONE) 5 MG immediate release tablet   Oral   Take 1 tablet (5 mg total) by mouth every 8 (eight) hours as needed. Patient not taking: Reported on 12/22/2015   12 tablet   0   . pantoprazole (PROTONIX) 40 MG tablet   Oral   Take 1 tablet (40 mg total) by mouth 2 (two) times daily.   30 tablet   1   . sucralfate (CARAFATE) 1 g tablet   Oral   Take 1 tablet (1 g total) by mouth 4  (four) times daily.   120 tablet   1   . sulfamethoxazole-trimethoprim (BACTRIM DS,SEPTRA DS) 800-160 MG tablet   Oral   Take 2 tablets by mouth 2 (two) times daily. Patient not taking: Reported on 10/19/2015   40 tablet   0   . tiZANidine (ZANAFLEX) 4 MG tablet   Oral   Take 1 tablet (4 mg total) by mouth every 8 (eight) hours as needed for muscle spasms. Patient not taking: Reported on 12/22/2015   30 tablet   0   . traMADol (ULTRAM) 50 MG tablet   Oral   Take 1 tablet (50 mg total) by mouth every 6 (six) hours as needed.   20 tablet   0     Allergies:  Review of patient's allergies indicates no known allergies.  Family History: Family History  Problem Relation Age of Onset  . Stroke Mother   . Heart disease Mother   . Arthritis Mother   . Cancer Father     Pancreatic  . Arthritis Daughter     Rheumatoid    Social History: Social History  Substance Use Topics  . Smoking status: Current Every Day Smoker -- 0.50 packs/day for 15 years    Types: Cigarettes  . Smokeless tobacco: Never Used  . Alcohol Use: Yes     Comment: regular (heavy) ETOH consumption-denies alcohol use x1 week on 10/08/15     Review of Systems:   10 point review of systems was performed and was otherwise negative:  Constitutional: No fever Eyes: No visual disturbances ENT: No sore throat, ear pain Cardiac: No chest pain Respiratory: No shortness of breath, wheezing, or stridor Abdomen: Patient points primarily to the left middle quadrant and states he hasn't had associated gas Endocrine: No weight loss, No night sweats Extremities: No peripheral edema, cyanosis Skin: No rashes, easy bruising Neurologic: No focal weakness, trouble with speech or swollowing Urologic: No dysuria, Hematuria, or urinary frequency **  Physical Exam:  ED Triage Vitals  Enc Vitals Group     BP 03/29/16 1551 137/82 mmHg     Pulse Rate 03/29/16 1551 85     Resp 03/29/16 1551 18     Temp 03/29/16 1551  98.1 F (36.7 C)     Temp src --      SpO2 03/29/16 1551 98 %     Weight 03/29/16 1551 167 lb (75.751 kg)     Height 03/29/16 1551 6\' 1"  (1.854 m)     Head Cir --      Peak Flow --  Pain Score 03/29/16 1551 10     Pain Loc --      Pain Edu? --      Excl. in [place]? --     General: Awake , Alert , and Oriented times 3; GCS 15 Head: Normal cephalic , atraumatic Eyes: Pupils equal , round, reactive to light Nose/Throat: No nasal drainage, patent upper airway without erythema or exudate.  Neck: Supple, Full range of motion, No anterior adenopathy or palpable thyroid masses Lungs: Clear to ascultation without wheezes , rhonchi, or rales Heart: Regular rate, regular rhythm without murmurs , gallops , or rubs Abdomen: Mild tenderness to deep palpation across the epigastric area without rebound, guarding , or rigidity; bowel sounds positive and symmetric in all 4 quadrants. No organomegaly .        Extremities: 2 plus symmetric pulses. No edema, clubbing or cyanosis Neurologic: normal ambulation, Motor symmetric without deficits, sensory intact Skin: warm, dry, no rashes   Labs:   All laboratory work was reviewed including any pertinent negatives or positives listed below:  Labs Reviewed  COMPREHENSIVE METABOLIC PANEL - Abnormal; Notable for the following:    Glucose, Bld 110 (*)    AST 90 (*)    ALT 168 (*)    All other components within normal limits  URINALYSIS COMPLETEWITH MICROSCOPIC ([facility] ONLY) - Abnormal; Notable for the following:    Color, Urine YELLOW (*)    APPearance CLEAR (*)    Squamous Epithelial / LPF 0-5 (*)    All other components within normal limits  GASTROINTESTINAL PANEL BY PCR, STOOL (REPLACES STOOL CULTURE)  LIPASE, BLOOD  CBC  TROPONIN I  Laboratory work was reviewed and showed no clinically significant abnormalities.   EKG:  ED ECG REPORT I, Daymon Larsen, the attending physician, personally viewed and interpreted this ECG.  Date:  03/29/2016 EKG Time: 1702 Rate: 70 Rhythm: normal sinus rhythm QRS Axis: normal Intervals: normal ST/T Wave abnormalities: normal Conduction Disturbances: none Narrative Interpretation: unremarkable    Radiology: *  Final result by Rad Results In Interface (03/29/16 19:06:57)   Narrative:   CLINICAL DATA: Severe left-sided abdominal pain. 30 pound weight loss over past 3 weeks. Personal history of pancreatitis.  EXAM: CT ABDOMEN AND PELVIS WITHOUT CONTRAST  TECHNIQUE: Multidetector CT imaging of the abdomen and pelvis was performed following the standard protocol without IV contrast.  COMPARISON: 10/05/2015  FINDINGS: Lower chest: No acute findings.  Hepatobiliary: No mass visualized on this un-enhanced exam. Gallbladder is unremarkable.  Pancreas: No mass or inflammatory process identified on this un-enhanced exam.  Spleen: Within normal limits in size.  Adrenals/Urinary Tract: No evidence of urolithiasis or hydronephrosis. Unopacified urinary bladder is unremarkable in appearance.  Stomach/Bowel: No evidence of obstruction, inflammatory process, or abnormal fluid collections. Normal appendix visualized.  Vascular/Lymphatic: No pathologically enlarged lymph nodes. No evidence of abdominal aortic aneurysm.  Reproductive: No mass or other significant abnormality.  Other: None.  Musculoskeletal: No suspicious bone lesions identified.  IMPRESSION: No evidence of urolithiasis, hydronephrosis, or other acute findings.   Electronically Signed By: Earle Gell M.D.         I personally reviewed the radiologic studies    ED Course: * Patient's stay was uneventful was given a GI cocktail, IV fluids, IV Zofran. He still has a lot of belching and gas that felt was unlikely that he has significant intra-abdominal source their required surgical management or inpatient management. The patient does not appear to be dehydrated at this time given his  recent hiking etc. likely explains his weight loss with his decreased appetite. The patient may have a GI source of infection and he was advised continue with her recommendations for testing of his stool sample. Patient has supplies and declined on given [place] a stool sample here in emergency department. He will be placed back on Carafate and was also increased on his Protonix.    Assessment: * Acute unspecified abdominal pain History of hepatitis C   Final Clinical Impression: *  Final diagnoses:  Abdominal pain in male     Plan:  Outpatient management Patient was advised to return immediately if condition worsens. Patient was advised to follow up with their primary care physician or other specialized physicians involved in their outpatient care. The patient and/or family member/power of attorney had laboratory results reviewed at the bedside. All questions and concerns were addressed and appropriate discharge instructions were distributed by the nursing staff.            Daymon Larsen, MD 03/29/16 2159

## 2016-06-26 ENCOUNTER — Other Ambulatory Visit: Payer: Self-pay

## 2016-07-05 ENCOUNTER — Emergency Department: Payer: Self-pay

## 2016-07-05 ENCOUNTER — Encounter: Payer: Self-pay | Admitting: Emergency Medicine

## 2016-07-05 ENCOUNTER — Telehealth: Payer: Self-pay | Admitting: Gastroenterology

## 2016-07-05 DIAGNOSIS — R208 Other disturbances of skin sensation: Secondary | ICD-10-CM | POA: Insufficient documentation

## 2016-07-05 DIAGNOSIS — F17210 Nicotine dependence, cigarettes, uncomplicated: Secondary | ICD-10-CM | POA: Insufficient documentation

## 2016-07-05 NOTE — Telephone Encounter (Signed)
Advised pt he wife should get a PCP to run labs to see if she does have Hep C. Pt stated she doesn't have health insurance. I told him about [place]'s pt assistance program and he will stop by today and pick up paperwork. He also stated that they think something is wrong with her GI tract as she has been sick for a couple of months and any food she eats comes out of her stool like it was never digested. I told him she really needs to see a doctor about this. He will get the paperwork to her.

## 2016-07-05 NOTE — ED Triage Notes (Signed)
Pt ambulatory to triage with steady gait, pt reports was grinding metal and reports "glowing chunk of metal" went down thorat. States having burning sensation to his throat, reports difficulty swallowing. Pt reports attempted to lay down but was unable to swallow or spit saliva. Pt reports incident occurred 3 hours PTA. Pt speaking in complete sentences, hoarse voice noted.

## 2016-07-05 NOTE — Telephone Encounter (Signed)
Patient feels his wife has hep C and needs to talk to you.

## 2016-07-06 ENCOUNTER — Emergency Department
Admission: EM | Admit: 2016-07-06 | Discharge: 2016-07-06 | Disposition: A | Discharge status: Home / Self Care | Payer: Self-pay | Attending: Emergency Medicine | Admitting: Emergency Medicine

## 2016-07-06 ENCOUNTER — Emergency Department: Payer: Self-pay

## 2016-07-06 DIAGNOSIS — R070 Pain in throat: Secondary | ICD-10-CM

## 2016-07-06 MED ORDER — LIDOCAINE VISCOUS 2 % MT SOLN
OROMUCOSAL | Status: AC
  Administered 2016-07-06: 15 mL via OROMUCOSAL
  Filled 2016-07-06: qty 15

## 2016-07-06 MED ORDER — LIDOCAINE VISCOUS 2 % MT SOLN
15.0000 mL | Freq: Once | OROMUCOSAL | Status: AC
  Administered 2016-07-06: 15 mL via OROMUCOSAL

## 2016-07-06 MED ORDER — GI COCKTAIL ~~LOC~~
90.0000 mL | Freq: Once | ORAL | Status: AC
  Administered 2016-07-06: 90 mL via ORAL
  Filled 2016-07-06: qty 90

## 2016-07-06 MED ORDER — GI COCKTAIL ~~LOC~~
30.0000 mL | Freq: Once | ORAL | Status: AC
  Administered 2016-07-06: 30 mL via ORAL
  Filled 2016-07-06: qty 30

## 2016-07-06 NOTE — ED Provider Notes (Signed)
[facility] Emergency Department Provider Note  ____________________________________________   First MD Initiated Contact with Patient 07/06/16 [phone]     (approximate)  I have reviewed the triage vital signs and the nursing notes.   HISTORY  Chief Complaint Burn to throat    HPI Larry Parrish is a 40 y.o. male presents with history of accidentally swallowing and then spitting out a piece of hot metal while grinding metal at 8 PM last night. Patient admits to pain with swallowing no difficulty breathing. Patient denies any abdominal pain. Patient states when he arrived home and attempted to go to bed he had difficulty swallowing which prompted his visit to the emergency department.   Past Medical History:  Diagnosis Date  . GI bleed   . Hepatitis C   . History of pancreatitis     Patient Active Problem List   Diagnosis Date Noted  . Abdominal pain, epigastric   . Reflux esophagitis   . Gastritis   . Abdominal pain, chronic, epigastric 10/13/2015  . Abscess of left arm   . Chronic hepatitis C without hepatic coma ([place])   . Drug abuse   . AA (alcohol abuse) 11/16/2014  . Hepatitis 09/28/2014  . HCV (hepatitis C virus) 08/12/2014  . Intravenous drug user 07/30/2014    Past Surgical History:  Procedure Laterality Date  . ESOPHAGOGASTRODUODENOSCOPY (EGD) WITH PROPOFOL N/A 10/21/2015   Procedure: ESOPHAGOGASTRODUODENOSCOPY (EGD) WITH PROPOFOL;  Surgeon: Lucilla Lame, MD;  Location: [facility];  Service: Endoscopy;  Laterality: N/A;  . HAND SURGERY Left 2008  . I&D EXTREMITY Left 10/08/2015   Arm  . TOOTH EXTRACTION      Prior to Admission medications   Medication Sig Start Date End Date Taking? Authorizing Provider  cephALEXin (KEFLEX) 500 MG capsule Take 1 capsule (500 mg total) by mouth 4 (four) times daily. Patient not taking: Reported on 10/19/2015 10/08/15   Delman Kitten, MD  naproxen (NAPROSYN) 500 MG tablet Take 1 tablet (500 mg  total) by mouth 2 (two) times daily with a meal. Patient not taking: Reported on 12/22/2015 11/08/15 11/07/16  Victorino Dike, FNP  omeprazole (PRILOSEC OTC) 20 MG tablet Take 1 tablet (20 mg total) by mouth 2 (two) times daily. Patient not taking: Reported on 12/22/2015 10/22/15 10/21/16  Hinda Kehr, MD  omeprazole (PRILOSEC) 40 MG capsule Take 1 capsule (40 mg total) by mouth daily. 12/17/15   Lucilla Lame, MD  ondansetron (ZOFRAN) 4 MG tablet Take 1 tablet (4 mg total) by mouth every 8 (eight) hours as needed for nausea or vomiting. 03/29/16   Daymon Larsen, MD  oxyCODONE (ROXICODONE) 5 MG immediate release tablet Take 1 tablet (5 mg total) by mouth every 8 (eight) hours as needed. Patient not taking: Reported on 12/22/2015 11/08/15 11/07/16  Victorino Dike, FNP  pantoprazole (PROTONIX) 40 MG tablet Take 1 tablet (40 mg total) by mouth 2 (two) times daily. 03/29/16 03/29/17  Daymon Larsen, MD  sucralfate (CARAFATE) 1 g tablet Take 1 tablet (1 g total) by mouth 4 (four) times daily. 03/29/16 03/29/17  Daymon Larsen, MD  sulfamethoxazole-trimethoprim (BACTRIM DS,SEPTRA DS) 800-160 MG tablet Take 2 tablets by mouth 2 (two) times daily. Patient not taking: Reported on 10/19/2015 10/08/15   Delman Kitten, MD  tiZANidine (ZANAFLEX) 4 MG tablet Take 1 tablet (4 mg total) by mouth every 8 (eight) hours as needed for muscle spasms. Patient not taking: Reported on 12/22/2015 11/08/15 11/07/16  Victorino Dike,  FNP  traMADol (ULTRAM) 50 MG tablet Take 1 tablet (50 mg total) by mouth every 6 (six) hours as needed. 03/29/16   Daymon Larsen, MD    Allergies Tylenol [acetaminophen]  Family History  Problem Relation Age of Onset  . Stroke Mother   . Heart disease Mother   . Arthritis Mother   . Cancer Father     Pancreatic  . Arthritis Daughter     Rheumatoid    Social History Social History  Substance Use Topics  . Smoking status: Current Every Day Smoker    Packs/day: 0.50    Years: 15.00    Types:  Cigarettes  . Smokeless tobacco: Never Used  . Alcohol use Yes     Comment: regular (heavy) ETOH consumption-denies alcohol use x1 week on 10/08/15    Review of Systems Constitutional: No fever/chills Eyes: No visual changes. ENT: Positive for pain with swallowing Cardiovascular: Denies chest pain. Respiratory: Denies shortness of breath. Gastrointestinal: No abdominal pain.  No nausea, no vomiting.  No diarrhea.  No constipation. Genitourinary: Negative for dysuria. Musculoskeletal: Negative for back pain. Skin: Negative for rash. Neurological: Negative for headaches, focal weakness or numbness.  10-point ROS otherwise negative.  ____________________________________________   PHYSICAL EXAM:  VITAL SIGNS: ED Triage Vitals  Enc Vitals Group     BP 07/05/16 2333 138/83     Pulse Rate 07/05/16 2333 88     Resp 07/05/16 2333 (!) 22     Temp 07/05/16 2333 98.9 F (37.2 C)     Temp Source 07/05/16 2333 Oral     SpO2 07/05/16 2333 100 %     Weight 07/05/16 2334 180 lb (81.6 kg)     Height 07/05/16 2334 6\' 2"  (1.88 m)     Head Circumference --      Peak Flow --      Pain Score 07/05/16 2334 10     Pain Loc --      Pain Edu? --      Excl. in [place]? --     Constitutional: Alert and oriented. Well appearing and in no acute distress. Eyes: Conjunctivae are normal. PERRL. EOMI. Head: Atraumatic. Ears:  Healthy appearing ear canals and TMs bilaterally Nose: No congestion/rhinnorhea. Mouth/Throat: Mucous membranes are moist.  Oropharynx non-erythematous. Neck: No stridor.  No meningeal signs. No injury noted in the posterior oropharynx  Cardiovascular: Normal rate, regular rhythm. Good peripheral circulation. Grossly normal heart sounds. Respiratory: Normal respiratory effort.  No retractions. Lungs CTAB. Gastrointestinal: Soft and nontender. No distention.  Musculoskeletal: No lower extremity tenderness nor edema. No gross deformities of extremities. Neurologic:  Normal speech  and language. No gross focal neurologic deficits are appreciated.  Skin:  Skin is warm, dry and intact. No rash noted.      RADIOLOGY I, [place], personally viewed and evaluated these images (plain radiographs) as part of my medical decision making, as well as reviewing the written report by the radiologist.  Dg Neck Soft Tissue  Result Date: 07/06/2016 CLINICAL DATA:  Possible metal in throat. Patient reports grinding metal, a piece of which went down throat. Difficulty swallowing. EXAM: NECK SOFT TISSUES - 1+ VIEW COMPARISON:  None. FINDINGS: No radio-opaque foreign body identified. There is no evidence of retropharyngeal soft tissue swelling or epiglottic enlargement. The cervical airway is patent. No subcutaneous emphysema or upper pneumomediastinum. IMPRESSION: No radiopaque foreign body. Electronically Signed   By: Jeb Levering M.D.   On: 07/06/2016 00:14   Ct Soft Tissue  Neck Wo Contrast  Result Date: 07/06/2016 CLINICAL DATA:  40 y/o M; patient was grinding metal today and reports a cooling trunk of mental: Down is throat. There is a burning sensation in the throat, difficulty swallowing. EXAM: CT NECK WITHOUT CONTRAST TECHNIQUE: Multidetector CT imaging of the neck was performed following the standard protocol without intravenous contrast. COMPARISON:  CT angiogram of the neck 10/11/2007 FINDINGS: Pharynx and larynx: There is markedly mucosal thickening centered in the right posterior lateral hypopharynx with inferior extension into the right paraglottic fat, superior extension along the aryepiglottic and glossopharyngeal fold is, and thickening of right aspect of epiglottis. The soft tissue thickening of the hypopharynx and right glottis exerts mass effect on the airway which is displaced rightward but remains patent. Additionally, there is fat stranding within the right carotid space and a right strap muscles at the level of the larynx. There is no subglottic inflammation.  Salivary glands: Normal. Thyroid: Normal. Lymph nodes: No lymphadenopathy. Vascular: Normal. Limited intracranial: Normal. Visualized orbits: The included in field of view. Mastoids and visualized paranasal sinuses: Normally aerated. Skeleton: No acute fracture or dislocation. No significant degenerative changes of the cervical spine. Upper chest: Stable right upper lobe 4 mm nodule, probably an intrapulmonary lymph node. No acute abnormality. IMPRESSION: Severe mucosal thickening centered in the right posterior lateral hypopharynx with extension into the right glottic and oropharyngeal mucosa. There is mass effect on the airway at the level of the hypopharynx and glottis which is displaced leftward but remains patent. Given history this probably represents a burn and reactive inflammation. No metallic foreign body is identified in the neck. No evidence for perforation. Electronically Signed   By: Kristine Garbe M.D.   On: 07/06/2016 03:09    Procedures     INITIAL IMPRESSION / ASSESSMENT AND PLAN / ED COURSE  Pertinent labs & imaging results that were available during my care of the patient were reviewed by me and considered in my medical decision making (see chart for details).  Patient given GI cocktail which she was able to swallow with improvement of pain.   Clinical Course    ____________________________________________  FINAL CLINICAL IMPRESSION(S) / ED DIAGNOSES  Final diagnoses:  Throat burning     MEDICATIONS GIVEN DURING THIS VISIT:  Medications  lidocaine (XYLOCAINE) 2 % viscous mouth solution 15 mL (15 mLs Mouth/Throat Given 07/06/16 0212)  gi cocktail (Maalox,Lidocaine,Donnatal) (30 mLs Oral Given 07/06/16 0303)  gi cocktail (Maalox,Lidocaine,Donnatal) (90 mLs Oral Given 07/06/16 0305)     NEW OUTPATIENT MEDICATIONS STARTED DURING THIS VISIT:  New Prescriptions   No medications on file      Note:  This document was prepared using Dragon voice  recognition software and may include unintentional dictation errors.    Gregor Hams, MD 07/06/16 [phone]

## 2016-07-26 ENCOUNTER — Encounter: Payer: Self-pay | Admitting: Intensive Care

## 2016-07-26 ENCOUNTER — Emergency Department
Admission: EM | Admit: 2016-07-26 | Discharge: 2016-07-26 | Disposition: A | Discharge status: Home / Self Care | Payer: Medicaid Other | Attending: Emergency Medicine | Admitting: Emergency Medicine

## 2016-07-26 DIAGNOSIS — Z791 Long term (current) use of non-steroidal anti-inflammatories (NSAID): Secondary | ICD-10-CM | POA: Insufficient documentation

## 2016-07-26 DIAGNOSIS — Z79899 Other long term (current) drug therapy: Secondary | ICD-10-CM | POA: Insufficient documentation

## 2016-07-26 DIAGNOSIS — R1013 Epigastric pain: Secondary | ICD-10-CM

## 2016-07-26 DIAGNOSIS — Z792 Long term (current) use of antibiotics: Secondary | ICD-10-CM | POA: Insufficient documentation

## 2016-07-26 DIAGNOSIS — K2970 Gastritis, unspecified, without bleeding: Secondary | ICD-10-CM | POA: Insufficient documentation

## 2016-07-26 DIAGNOSIS — F17210 Nicotine dependence, cigarettes, uncomplicated: Secondary | ICD-10-CM | POA: Insufficient documentation

## 2016-07-26 LAB — CBC
HCT: 48.1 % (ref 40.0–52.0)
Hemoglobin: 17 g/dL (ref 13.0–18.0)
MCH: 33 pg (ref 26.0–34.0)
MCHC: 35.5 g/dL (ref 32.0–36.0)
MCV: 93.2 fL (ref 80.0–100.0)
PLATELETS: 256 10*3/uL (ref 150–440)
RBC: 5.16 MIL/uL (ref 4.40–5.90)
RDW: 13.3 % (ref 11.5–14.5)
WBC: 8.7 10*3/uL (ref 3.8–10.6)

## 2016-07-26 LAB — COMPREHENSIVE METABOLIC PANEL
ALT: 20 U/L (ref 17–63)
AST: 28 U/L (ref 15–41)
Albumin: 4.6 g/dL (ref 3.5–5.0)
Alkaline Phosphatase: 61 U/L (ref 38–126)
Anion gap: 6 (ref 5–15)
BUN: 16 mg/dL (ref 6–20)
CHLORIDE: 103 mmol/L (ref 101–111)
CO2: 28 mmol/L (ref 22–32)
CREATININE: 0.99 mg/dL (ref 0.61–1.24)
Calcium: 9.8 mg/dL (ref 8.9–10.3)
GFR calc Af Amer: >60 mL/min (ref >60)
GFR calc non Af Amer: >60 mL/min (ref >60)
Glucose, Bld: 167 mg/dL — ABNORMAL HIGH (ref 65–99)
POTASSIUM: 3.8 mmol/L (ref 3.5–5.1)
SODIUM: 137 mmol/L (ref 135–145)
Total Bilirubin: 2.5 mg/dL — ABNORMAL HIGH (ref 0.3–1.2)
Total Protein: 8.5 g/dL — ABNORMAL HIGH (ref 6.5–8.1)

## 2016-07-26 LAB — LIPASE, BLOOD: Lipase: 33 U/L (ref 11–51)

## 2016-07-26 LAB — TROPONIN I

## 2016-07-26 MED ORDER — OXYCODONE-ACETAMINOPHEN 5-325 MG PO TABS
ORAL_TABLET | ORAL | Status: DC
Start: 2016-07-26 — End: 2016-07-26
  Filled 2016-07-26: qty 2

## 2016-07-26 MED ORDER — OXYCODONE HCL 5 MG PO TABS: 5.0000 mg | ORAL_TABLET | Freq: Three times a day (TID) | ORAL | 0 refills | Status: DC | PRN

## 2016-07-26 MED ORDER — KETOROLAC TROMETHAMINE 30 MG/ML IJ SOLN
30.0000 mg | Freq: Once | INTRAMUSCULAR | Status: AC
  Administered 2016-07-26: 30 mg via INTRAVENOUS
  Filled 2016-07-26: qty 1

## 2016-07-26 MED ORDER — OXYCODONE-ACETAMINOPHEN 5-325 MG PO TABS
2.0000 | ORAL_TABLET | Freq: Once | ORAL | Status: AC
  Administered 2016-07-26: 2 via ORAL

## 2016-07-26 MED ORDER — PANTOPRAZOLE SODIUM 20 MG PO TBEC: 20.0000 mg | DELAYED_RELEASE_TABLET | Freq: Every day | ORAL | 1 refills | Status: DC

## 2016-07-26 MED ORDER — ONDANSETRON HCL 4 MG/2ML IJ SOLN
4.0000 mg | Freq: Once | INTRAMUSCULAR | Status: AC
  Administered 2016-07-26: 4 mg via INTRAVENOUS
  Filled 2016-07-26: qty 2

## 2016-07-26 MED ORDER — SODIUM CHLORIDE 0.9 % IV BOLUS (SEPSIS)
1000.0000 mL | Freq: Once | INTRAVENOUS | Status: AC
  Administered 2016-07-26: 1000 mL via INTRAVENOUS

## 2016-07-26 NOTE — ED Notes (Signed)
Pt is aware of need for urine sample - given cup of water and warm blanket at this time

## 2016-07-26 NOTE — ED Provider Notes (Signed)
[facility] Emergency Department Provider Note  Time seen: 9:50 AM  I have reviewed the triage vital signs and the nursing notes.   HISTORY  Chief Complaint Abdominal Pain    HPI Larry Parrish is a 40 y.o. male with a past medical history of hepatitis, pancreatitis, gastritis, who presents to the emergency department for abdominal pain. According to the patient he states a chronic history of upper and left upper quadrant abdominal pain. Patient states he had been clean from alcohol and drugs for quite a while until Friday evening. He states Friday Saturday and Sunday and Monday he did an extensive amount of cocaine both snorting and intravenously, as well as an extensive amount of alcohol. Patient states since Monday night he has been experiencing increased epigastric and left upper quadrant pain. States some nausea but denies vomiting. Denies diarrhea. Denies fever. Denies chest pain.  Past Medical History:  Diagnosis Date  . GI bleed   . Hepatitis C   . History of pancreatitis     Patient Active Problem List   Diagnosis Date Noted  . Abdominal pain, epigastric   . Reflux esophagitis   . Gastritis   . Abdominal pain, chronic, epigastric 10/13/2015  . Abscess of left arm   . Chronic hepatitis C without hepatic coma ([place])   . Drug abuse   . AA (alcohol abuse) 11/16/2014  . Hepatitis 09/28/2014  . HCV (hepatitis C virus) 08/12/2014  . Intravenous drug user 07/30/2014    Past Surgical History:  Procedure Laterality Date  . ESOPHAGOGASTRODUODENOSCOPY (EGD) WITH PROPOFOL N/A 10/21/2015   Procedure: ESOPHAGOGASTRODUODENOSCOPY (EGD) WITH PROPOFOL;  Surgeon: Lucilla Lame, MD;  Location: [facility];  Service: Endoscopy;  Laterality: N/A;  . HAND SURGERY Left 2008  . I&D EXTREMITY Left 10/08/2015   Arm  . TOOTH EXTRACTION      Prior to Admission medications   Medication Sig Start Date End Date Taking? Authorizing Provider  cephALEXin (KEFLEX)  500 MG capsule Take 1 capsule (500 mg total) by mouth 4 (four) times daily. Patient not taking: Reported on 10/19/2015 10/08/15   Delman Kitten, MD  naproxen (NAPROSYN) 500 MG tablet Take 1 tablet (500 mg total) by mouth 2 (two) times daily with a meal. Patient not taking: Reported on 12/22/2015 11/08/15 11/07/16  Victorino Dike, FNP  omeprazole (PRILOSEC OTC) 20 MG tablet Take 1 tablet (20 mg total) by mouth 2 (two) times daily. Patient not taking: Reported on 12/22/2015 10/22/15 10/21/16  Hinda Kehr, MD  omeprazole (PRILOSEC) 40 MG capsule Take 1 capsule (40 mg total) by mouth daily. 12/17/15   Lucilla Lame, MD  ondansetron (ZOFRAN) 4 MG tablet Take 1 tablet (4 mg total) by mouth every 8 (eight) hours as needed for nausea or vomiting. 03/29/16   Daymon Larsen, MD  oxyCODONE (ROXICODONE) 5 MG immediate release tablet Take 1 tablet (5 mg total) by mouth every 8 (eight) hours as needed. Patient not taking: Reported on 12/22/2015 11/08/15 11/07/16  Victorino Dike, FNP  pantoprazole (PROTONIX) 40 MG tablet Take 1 tablet (40 mg total) by mouth 2 (two) times daily. 03/29/16 03/29/17  Daymon Larsen, MD  sucralfate (CARAFATE) 1 g tablet Take 1 tablet (1 g total) by mouth 4 (four) times daily. 03/29/16 03/29/17  Daymon Larsen, MD  sulfamethoxazole-trimethoprim (BACTRIM DS,SEPTRA DS) 800-160 MG tablet Take 2 tablets by mouth 2 (two) times daily. Patient not taking: Reported on 10/19/2015 10/08/15   Delman Kitten, MD  tiZANidine (ZANAFLEX)  4 MG tablet Take 1 tablet (4 mg total) by mouth every 8 (eight) hours as needed for muscle spasms. Patient not taking: Reported on 12/22/2015 11/08/15 11/07/16  Victorino Dike, FNP  traMADol (ULTRAM) 50 MG tablet Take 1 tablet (50 mg total) by mouth every 6 (six) hours as needed. 03/29/16   Daymon Larsen, MD    Allergies  Allergen Reactions  . Tylenol [Acetaminophen]     Pt has hepatitis C     Family History  Problem Relation Age of Onset  . Stroke Mother   . Heart disease  Mother   . Arthritis Mother   . Cancer Father     Pancreatic  . Arthritis Daughter     Rheumatoid    Social History Social History  Substance Use Topics  . Smoking status: Current Every Day Smoker    Packs/day: 0.50    Years: 15.00    Types: Cigarettes  . Smokeless tobacco: Never Used  . Alcohol use Yes     Comment: regular (heavy) ETOH consumption-denies alcohol use x1 week on 10/08/15    Review of Systems Constitutional: Negative for fever. Cardiovascular: Negative for chest pain. Respiratory: Negative for shortness of breath. Gastrointestinal: Negative for abdominal pain Musculoskeletal: Negative for back pain Neurological: Negative for headache 10-point ROS otherwise negative.  ____________________________________________   PHYSICAL EXAM:  VITAL SIGNS: ED Triage Vitals  Enc Vitals Group     BP 07/26/16 0934 (!) 143/110     Pulse Rate 07/26/16 0934 97     Resp 07/26/16 0934 20     Temp 07/26/16 0934 98.1 F (36.7 C)     Temp Source 07/26/16 0934 Oral     SpO2 07/26/16 0934 97 %     Weight 07/26/16 0934 180 lb (81.6 kg)     Height 07/26/16 0934 6\' 2"  (1.88 m)     Head Circumference --      Peak Flow --      Pain Score 07/26/16 0935 6     Pain Loc --      Pain Edu? --      Excl. in [place]? --     Constitutional: Alert and oriented. Well appearing and in no distress. Eyes: Normal exam ENT   Head: Normocephalic and atraumatic   Mouth/Throat: Mucous membranes are moist. Cardiovascular: Normal rate, regular rhythm. No murmur Respiratory: Normal respiratory effort without tachypnea nor retractions. Breath sounds are clear Gastrointestinal: Soft, mild epigastric and left upper quadrant tenderness palpation. No rebound or guarding. No distention. Musculoskeletal: Nontender with normal range of motion in all extremities.  Neurologic:  Normal speech and language. No gross focal neurologic deficits  Skin:  Skin is warm, dry and intact.  Psychiatric: Mood and  affect are normal.   ____________________________________________    EKG  EKG reviewed and interpreted by myself shows normal sinus rhythm at 82 bpm, narrow QRS, normal axis, normal intervals, no concerning ST changes, overall reassuring EKG.  ____________________________________________   INITIAL IMPRESSION / ASSESSMENT AND PLAN / ED COURSE  Pertinent labs & imaging results that were available during my care of the patient were reviewed by me and considered in my medical decision making (see chart for details).  The patient presents the emergency department with epigastric and left upper quadrant tenderness palpation. Patient states pain started Monday evening following a cocaine and alcohol binge over the weekend. Patient has a history of gastritis and pancreatitis in the past, as well as hepatitis C. Patient is in no distress  at this time. Mild tenderness to palpation. We will check labs, treat with Toradol, Zofran, IV fluids and closely monitor in the emergency department.  Patient's labs are largely within normal limits, lipase negative. Patient has a history of gastritis with his recent heavy alcohol intake of the suspected flare of gastritis. We'll discharge a very short course of pain medication, PPI, I discussed with the patient return precautions as well as abstaining from alcohol and drugs, the patient is agreeable. ____________________________________________   FINAL CLINICAL IMPRESSION(S) / ED DIAGNOSES  Upper abdominal pain    Harvest Dark, MD 07/26/16 1405

## 2016-07-26 NOTE — ED Notes (Signed)
Pt given a urinal and instructed on use.. Pt states he has not urinated since yesterday morning.Larry Parrish

## 2016-07-26 NOTE — ED Triage Notes (Signed)
Pt present to ER with abdominal pain. Patient has HX of hepatitis. Pt reports having cocaine by injection and snorting Friday- Monday (about 15grams). Pt states he had been clean for months before his relapse this past weekend. Reports his abdominal pain started Monday evening. Pt denies N/V/D. Patient c/o dizziness since this AM and extreme abdominal pain. A&Ox4.

## 2016-07-26 NOTE — ED Notes (Signed)
PT C/O 6/10 ABD PAIN AND REQUESTED PAIN MEDICATION - VO RECEIVED FROM MD FOR PERCOCET (SEE MAR)

## 2016-08-02 ENCOUNTER — Ambulatory Visit: Payer: Self-pay | Admitting: Gastroenterology

## 2016-08-03 MED ORDER — CHLORDIAZEPOXIDE HCL 25 MG PO CAPS
ORAL_CAPSULE | ORAL | Status: AC
  Filled 2016-08-03: qty 2

## 2016-08-21 ENCOUNTER — Emergency Department: Payer: Medicaid Other

## 2016-08-21 ENCOUNTER — Emergency Department
Admission: EM | Admit: 2016-08-21 | Discharge: 2016-08-21 | Disposition: A | Discharge status: Home / Self Care | Payer: Medicaid Other | Attending: Emergency Medicine | Admitting: Emergency Medicine

## 2016-08-21 DIAGNOSIS — F17210 Nicotine dependence, cigarettes, uncomplicated: Secondary | ICD-10-CM | POA: Insufficient documentation

## 2016-08-21 DIAGNOSIS — Z79899 Other long term (current) drug therapy: Secondary | ICD-10-CM | POA: Insufficient documentation

## 2016-08-21 DIAGNOSIS — J209 Acute bronchitis, unspecified: Secondary | ICD-10-CM | POA: Insufficient documentation

## 2016-08-21 DIAGNOSIS — Z792 Long term (current) use of antibiotics: Secondary | ICD-10-CM | POA: Insufficient documentation

## 2016-08-21 DIAGNOSIS — Z791 Long term (current) use of non-steroidal anti-inflammatories (NSAID): Secondary | ICD-10-CM | POA: Insufficient documentation

## 2016-08-21 LAB — BASIC METABOLIC PANEL
Anion gap: 6 (ref 5–15)
BUN: 15 mg/dL (ref 6–20)
CHLORIDE: 102 mmol/L (ref 101–111)
CO2: 28 mmol/L (ref 22–32)
Calcium: 9.7 mg/dL (ref 8.9–10.3)
Creatinine, Ser: 1.03 mg/dL (ref 0.61–1.24)
GFR calc non Af Amer: >60 mL/min (ref >60)
Glucose, Bld: 97 mg/dL (ref 65–99)
POTASSIUM: 4.3 mmol/L (ref 3.5–5.1)
SODIUM: 136 mmol/L (ref 135–145)

## 2016-08-21 LAB — CBC
HEMATOCRIT: 52.1 % — AB (ref 40.0–52.0)
HEMOGLOBIN: 17.9 g/dL (ref 13.0–18.0)
MCH: 32.2 pg (ref 26.0–34.0)
MCHC: 34.5 g/dL (ref 32.0–36.0)
MCV: 93.4 fL (ref 80.0–100.0)
Platelets: 331 10*3/uL (ref 150–440)
RBC: 5.58 MIL/uL (ref 4.40–5.90)
RDW: 12.5 % (ref 11.5–14.5)
WBC: 7.8 10*3/uL (ref 3.8–10.6)

## 2016-08-21 MED ORDER — AZITHROMYCIN 250 MG PO TABS: ORAL_TABLET | ORAL | 0 refills | Status: DC

## 2016-08-21 MED ORDER — ALBUTEROL SULFATE HFA 108 (90 BASE) MCG/ACT IN AERS
2.0000 | INHALATION_SPRAY | RESPIRATORY_TRACT | 0 refills | Status: DC | PRN
Start: 2016-08-21 — End: 2016-10-19

## 2016-08-21 MED ORDER — DEXAMETHASONE SODIUM PHOSPHATE 10 MG/ML IJ SOLN
10.0000 mg | Freq: Once | INTRAMUSCULAR | Status: AC
  Administered 2016-08-21: 10 mg via INTRAMUSCULAR
  Filled 2016-08-21: qty 1

## 2016-08-21 MED ORDER — IPRATROPIUM-ALBUTEROL 0.5-2.5 (3) MG/3ML IN SOLN
3.0000 mL | Freq: Once | RESPIRATORY_TRACT | Status: AC
  Administered 2016-08-21: 3 mL via RESPIRATORY_TRACT
  Filled 2016-08-21: qty 3

## 2016-08-21 NOTE — ED Triage Notes (Addendum)
Yellow productive cough, hot and cold flashes, SOB X 1 week. Pt states that he was sent from [facility]. Decreased appetite, burning with urination, fatigue.

## 2016-08-21 NOTE — ED Provider Notes (Signed)
[facility] Emergency Department Provider Note  ____________________________________________  Time seen: Approximately 5:12 PM  I have reviewed the triage vital signs and the nursing notes.   HISTORY  Chief Complaint Pneumonia (pt thinks he has pneumonia)    HPI Larry Parrish is a 40 y.o. male who complains of productive cough, subjective fevers chills and sweats, and shortness of breath for the past week. Not getting any better.Does report a long history of smoking but does not have any known diagnosis of lung disease. No chest pain. No dizziness or syncope.     Past Medical History:  Diagnosis Date  . GI bleed   . Hepatitis C   . History of pancreatitis      Patient Active Problem List   Diagnosis Date Noted  . Abdominal pain, epigastric   . Reflux esophagitis   . Gastritis   . Abdominal pain, chronic, epigastric 10/13/2015  . Abscess of left arm   . Chronic hepatitis C without hepatic coma ([place])   . Drug abuse   . AA (alcohol abuse) 11/16/2014  . Hepatitis 09/28/2014  . HCV (hepatitis C virus) 08/12/2014  . Intravenous drug user 07/30/2014     Past Surgical History:  Procedure Laterality Date  . ESOPHAGOGASTRODUODENOSCOPY (EGD) WITH PROPOFOL N/A 10/21/2015   Procedure: ESOPHAGOGASTRODUODENOSCOPY (EGD) WITH PROPOFOL;  Surgeon: Lucilla Lame, MD;  Location: [place];  Service: Endoscopy;  Laterality: N/A;  . HAND SURGERY Left 2008  . I&D EXTREMITY Left 10/08/2015   Arm  . TOOTH EXTRACTION       Prior to Admission medications   Medication Sig Start Date End Date Taking? Authorizing Provider  azithromycin (ZITHROMAX Z-PAK) 250 MG tablet Take 2 tablets (500 mg) on  Day 1,  followed by 1 tablet (250 mg) once daily on Days 2 through 5. 08/21/16   Carrie Mew, MD  cephALEXin (KEFLEX) 500 MG capsule Take 1 capsule (500 mg total) by mouth 4 (four) times daily. Patient not taking: Reported on 10/19/2015 10/08/15   Delman Kitten,  MD  naproxen (NAPROSYN) 500 MG tablet Take 1 tablet (500 mg total) by mouth 2 (two) times daily with a meal. Patient not taking: Reported on 12/22/2015 11/08/15 11/07/16  Victorino Dike, FNP  omeprazole (PRILOSEC OTC) 20 MG tablet Take 1 tablet (20 mg total) by mouth 2 (two) times daily. Patient not taking: Reported on 12/22/2015 10/22/15 10/21/16  Hinda Kehr, MD  omeprazole (PRILOSEC) 40 MG capsule Take 1 capsule (40 mg total) by mouth daily. 12/17/15   Lucilla Lame, MD  ondansetron (ZOFRAN) 4 MG tablet Take 1 tablet (4 mg total) by mouth every 8 (eight) hours as needed for nausea or vomiting. 03/29/16   Daymon Larsen, MD  oxyCODONE (ROXICODONE) 5 MG immediate release tablet Take 1 tablet (5 mg total) by mouth every 8 (eight) hours as needed. 07/26/16 07/26/17  Harvest Dark, MD  pantoprazole (PROTONIX) 20 MG tablet Take 1 tablet (20 mg total) by mouth daily. 07/26/16 07/26/17  Harvest Dark, MD  sucralfate (CARAFATE) 1 g tablet Take 1 tablet (1 g total) by mouth 4 (four) times daily. 03/29/16 03/29/17  Daymon Larsen, MD  sulfamethoxazole-trimethoprim (BACTRIM DS,SEPTRA DS) 800-160 MG tablet Take 2 tablets by mouth 2 (two) times daily. Patient not taking: Reported on 10/19/2015 10/08/15   Delman Kitten, MD  tiZANidine (ZANAFLEX) 4 MG tablet Take 1 tablet (4 mg total) by mouth every 8 (eight) hours as needed for muscle spasms. Patient not taking: Reported on  12/22/2015 11/08/15 11/07/16  Victorino Dike, FNP  traMADol (ULTRAM) 50 MG tablet Take 1 tablet (50 mg total) by mouth every 6 (six) hours as needed. 03/29/16   Daymon Larsen, MD     Allergies Tylenol [acetaminophen]   Family History  Problem Relation Age of Onset  . Stroke Mother   . Heart disease Mother   . Arthritis Mother   . Cancer Father     Pancreatic  . Arthritis Daughter     Rheumatoid    Social History Social History  Substance Use Topics  . Smoking status: Current Every Day Smoker    Packs/day: 0.50    Years: 15.00     Types: Cigarettes  . Smokeless tobacco: Never Used  . Alcohol use Yes     Comment: regular (heavy) ETOH consumption-denies alcohol use x1 week on 10/08/15    Review of Systems  Constitutional:   No fever or chills.  ENT:   No sore throat. No rhinorrhea. Cardiovascular:   No chest pain. Respiratory:  Shortness of breath and cough. Gastrointestinal:   Negative for abdominal pain, vomiting and diarrhea.  Musculoskeletal:   Negative for focal pain or swelling 10-point ROS otherwise negative.  ____________________________________________   PHYSICAL EXAM:  VITAL SIGNS: ED Triage Vitals  Enc Vitals Group     BP 08/21/16 1445 (!) 149/111     Pulse Rate 08/21/16 1444 (!) 108     Resp 08/21/16 1444 18     Temp 08/21/16 1444 98.6 F (37 C)     Temp Source 08/21/16 1444 Oral     SpO2 08/21/16 1444 97 %     Weight 08/21/16 1444 180 lb (81.6 kg)     Height 08/21/16 1444 6\' 2"  (1.88 m)     Head Circumference --      Peak Flow --      Pain Score 08/21/16 1444 0     Pain Loc --      Pain Edu? --      Excl. in [place]? --     Vital signs reviewed, nursing assessments reviewed.   Constitutional:   Alert and oriented. Well appearing and in no distress. Eyes:   No scleral icterus. No conjunctival pallor. PERRL. EOMI.  No nystagmus. ENT   Head:   Normocephalic and atraumatic.   Nose:   No congestion/rhinnorhea. No septal hematoma   Mouth/Throat:   MMM, no pharyngeal erythema. No peritonsillar mass.    Neck:   No stridor. No SubQ emphysema. No meningismus. Hematological/Lymphatic/Immunilogical:   No cervical lymphadenopathy. Cardiovascular:   RRR. Symmetric bilateral radial and DP pulses.  No murmurs.  Respiratory:   Normal respiratory effort without tachypnea nor retractions.diffuse expiratory wheezing, worse with forceful expiration which also provokes a coughing fit. No focal consolidative findings. Gastrointestinal:   Soft and nontender. Non distended. There is no CVA  tenderness.  No rebound, rigidity, or guarding. Genitourinary:   deferred Musculoskeletal:   Nontender with normal range of motion in all extremities. No joint effusions.  No lower extremity tenderness.  No edema. Neurologic:   Normal speech and language.  CN 2-10 normal. Motor grossly intact. No gross focal neurologic deficits are appreciated.  Skin:    Skin is warm, dry and intact. No rash noted.  No petechiae, purpura, or bullae.  ____________________________________________    LABS (pertinent positives/negatives) (all labs ordered are listed, but only abnormal results are displayed) Labs Reviewed  CBC - Abnormal; Notable for the following:  Result Value   HCT 52.1 (*)    All other components within normal limits  BASIC METABOLIC PANEL  URINALYSIS COMPLETEWITH MICROSCOPIC ([facility] ONLY)   ____________________________________________   EKG    ____________________________________________    RADIOLOGY  Chest x-ray unremarkable  ____________________________________________   PROCEDURES Procedures  ____________________________________________   INITIAL IMPRESSION / ASSESSMENT AND PLAN / ED COURSE  Pertinent labs & imaging results that were available during my care of the patient were reviewed by me and considered in my medical decision making (see chart for details).  Patient presents with signs and symptoms of acute bronchitis. Vital signs unremarkable, labs unremarkable. Patient given Decadron and DuoNeb and feels much better. Because of his smoking history and likely underlyinglung disease to some degree, prescribe the patient a zpak especially given the duration of his symptoms without improvement so far. Follow-up with his primarycare Dr. [facility].  Wells negative.Considering the patient's symptoms, medical history, and physical examination today, I have low suspicion for ACS, PE, TAD, pneumothorax, carditis, mediastinitis, pneumonia, CHF, or  sepsis.       Clinical Course   ____________________________________________   FINAL CLINICAL IMPRESSION(S) / ED DIAGNOSES  Final diagnoses:  Acute bronchitis, unspecified organism       Portions of this note were generated with dragon dictation software. Dictation errors may occur despite best attempts at proofreading.    Carrie Mew, MD 08/21/16 [phone]

## 2016-08-21 NOTE — ED Notes (Signed)
Pt unable to provide urine sample at this time 

## 2016-10-17 ENCOUNTER — Emergency Department
Admission: EM | Admit: 2016-10-17 | Discharge: 2016-10-17 | Disposition: A | Discharge status: Home / Self Care | Payer: Medicaid Other | Attending: Emergency Medicine | Admitting: Emergency Medicine

## 2016-10-17 ENCOUNTER — Encounter: Payer: Self-pay | Admitting: Emergency Medicine

## 2016-10-17 DIAGNOSIS — A599 Trichomoniasis, unspecified: Secondary | ICD-10-CM | POA: Insufficient documentation

## 2016-10-17 DIAGNOSIS — F17210 Nicotine dependence, cigarettes, uncomplicated: Secondary | ICD-10-CM | POA: Insufficient documentation

## 2016-10-17 DIAGNOSIS — Z113 Encounter for screening for infections with a predominantly sexual mode of transmission: Secondary | ICD-10-CM | POA: Insufficient documentation

## 2016-10-17 DIAGNOSIS — R1010 Upper abdominal pain, unspecified: Secondary | ICD-10-CM

## 2016-10-17 DIAGNOSIS — Z79899 Other long term (current) drug therapy: Secondary | ICD-10-CM | POA: Insufficient documentation

## 2016-10-17 LAB — COMPREHENSIVE METABOLIC PANEL
ALK PHOS: 57 U/L (ref 38–126)
ALT: 26 U/L (ref 17–63)
ANION GAP: 8 (ref 5–15)
AST: 34 U/L (ref 15–41)
Albumin: 4.4 g/dL (ref 3.5–5.0)
BUN: 15 mg/dL (ref 6–20)
CALCIUM: 9.4 mg/dL (ref 8.9–10.3)
CO2: 28 mmol/L (ref 22–32)
CREATININE: 0.98 mg/dL (ref 0.61–1.24)
Chloride: 102 mmol/L (ref 101–111)
Glucose, Bld: 134 mg/dL — ABNORMAL HIGH (ref 65–99)
Potassium: 3.8 mmol/L (ref 3.5–5.1)
SODIUM: 138 mmol/L (ref 135–145)
TOTAL PROTEIN: 7.7 g/dL (ref 6.5–8.1)
Total Bilirubin: 0.4 mg/dL (ref 0.3–1.2)

## 2016-10-17 LAB — URINALYSIS, COMPLETE (UACMP) WITH MICROSCOPIC
BILIRUBIN URINE: NEGATIVE
Bacteria, UA: NONE SEEN
GLUCOSE, UA: NEGATIVE mg/dL
HGB URINE DIPSTICK: NEGATIVE
KETONES UR: NEGATIVE mg/dL
LEUKOCYTES UA: NEGATIVE
NITRITE: NEGATIVE
PH: 5 (ref 5.0–8.0)
PROTEIN: NEGATIVE mg/dL
RBC / HPF: NONE SEEN RBC/hpf (ref 0–5)
Specific Gravity, Urine: 1.006 (ref 1.005–1.030)
WBC UA: NONE SEEN WBC/hpf (ref 0–5)

## 2016-10-17 LAB — CBC
HCT: 41.2 % (ref 40.0–52.0)
HEMOGLOBIN: 14.3 g/dL (ref 13.0–18.0)
MCH: 32.8 pg (ref 26.0–34.0)
MCHC: 34.7 g/dL (ref 32.0–36.0)
MCV: 94.5 fL (ref 80.0–100.0)
PLATELETS: 245 10*3/uL (ref 150–440)
RBC: 4.36 MIL/uL — AB (ref 4.40–5.90)
RDW: 12.4 % (ref 11.5–14.5)
WBC: 5.9 10*3/uL (ref 3.8–10.6)

## 2016-10-17 LAB — LIPASE, BLOOD: Lipase: 28 U/L (ref 11–51)

## 2016-10-17 MED ORDER — GI COCKTAIL ~~LOC~~
30.0000 mL | Freq: Once | ORAL | Status: AC
  Administered 2016-10-17: 30 mL via ORAL

## 2016-10-17 MED ORDER — TRAMADOL HCL 50 MG PO TABS
100.0000 mg | ORAL_TABLET | Freq: Once | ORAL | Status: AC
Start: 2016-10-17 — End: 2016-10-17
  Administered 2016-10-17: 100 mg via ORAL

## 2016-10-17 MED ORDER — GI COCKTAIL ~~LOC~~
ORAL | Status: AC
  Administered 2016-10-17: 30 mL via ORAL
  Filled 2016-10-17: qty 30

## 2016-10-17 MED ORDER — TRAMADOL HCL 50 MG PO TABS
ORAL_TABLET | ORAL | Status: AC
  Administered 2016-10-17: 100 mg via ORAL
  Filled 2016-10-17: qty 2

## 2016-10-17 MED ORDER — TRAMADOL HCL 50 MG PO TABS: 50.0000 mg | ORAL_TABLET | Freq: Four times a day (QID) | ORAL | 0 refills | Status: AC | PRN

## 2016-10-17 MED ORDER — METRONIDAZOLE 500 MG PO TABS: 500.0000 mg | ORAL_TABLET | Freq: Three times a day (TID) | ORAL | 0 refills | Status: AC

## 2016-10-17 NOTE — ED Triage Notes (Signed)
Pt to ed with c/o abd pain x 1 week.  +diarrhea. Pt states yesterday his wife was dx with trichomonas so he needs treatment for that as well.

## 2016-10-17 NOTE — ED Provider Notes (Signed)
[facility] Emergency Department Provider Note  Time seen: 5:14 PM  I have reviewed the triage vital signs and the nursing notes.   HISTORY  Chief Complaint Abdominal Pain    HPI Larry Parrish is a 40 y.o. male with a past medical history of hepatitis, GI bleed, pancreatitis, gastritis, presents to the emergency department for upper abdominal pain. According to the patient for the past 2 weeks he has been experiencing fairly constant upper abdominal pain which she describes as moderate to severe. States it has been worse over the past 3 days causing him difficulty when trying to sleep or eat. Denies any fever, nausea or vomiting. Patient states he called his GI Dr. Dr. Allen Norris, Nexium has an appointment for Thursday, but states the pain worsened so he could not wait any came to the ER for evaluation. Patient states as a secondary complaint his wife is in the emergency department yesterday and was diagnosed with trichomoniasis and he is requesting treatment for that as well. Denies any discharge. Denies any lower abdominal pain or dysuria.  Past Medical History:  Diagnosis Date  . GI bleed   . Hepatitis C   . History of pancreatitis     Patient Active Problem List   Diagnosis Date Noted  . Abdominal pain, epigastric   . Reflux esophagitis   . Gastritis   . Abdominal pain, chronic, epigastric 10/13/2015  . Abscess of left arm   . Chronic hepatitis C without hepatic coma ([place])   . Drug abuse   . AA (alcohol abuse) 11/16/2014  . Hepatitis 09/28/2014  . HCV (hepatitis C virus) 08/12/2014  . Intravenous drug user 07/30/2014    Past Surgical History:  Procedure Laterality Date  . ESOPHAGOGASTRODUODENOSCOPY (EGD) WITH PROPOFOL N/A 10/21/2015   Procedure: ESOPHAGOGASTRODUODENOSCOPY (EGD) WITH PROPOFOL;  Surgeon: Lucilla Lame, MD;  Location: [place];  Service: Endoscopy;  Laterality: N/A;  . HAND SURGERY Left 2008  . I&D EXTREMITY Left 10/08/2015    Arm  . TOOTH EXTRACTION      Prior to Admission medications   Medication Sig Start Date End Date Taking? Authorizing Provider  albuterol (PROVENTIL HFA) 108 (90 Base) MCG/ACT inhaler Inhale 2 puffs into the lungs every 4 (four) hours as needed for wheezing or shortness of breath. 08/21/16   Carrie Mew, MD  azithromycin (ZITHROMAX Z-PAK) 250 MG tablet Take 2 tablets (500 mg) on  Day 1,  followed by 1 tablet (250 mg) once daily on Days 2 through 5. 08/21/16   Carrie Mew, MD  cephALEXin (KEFLEX) 500 MG capsule Take 1 capsule (500 mg total) by mouth 4 (four) times daily. Patient not taking: Reported on 10/19/2015 10/08/15   Delman Kitten, MD  naproxen (NAPROSYN) 500 MG tablet Take 1 tablet (500 mg total) by mouth 2 (two) times daily with a meal. Patient not taking: Reported on 12/22/2015 11/08/15 11/07/16  Victorino Dike, FNP  omeprazole (PRILOSEC OTC) 20 MG tablet Take 1 tablet (20 mg total) by mouth 2 (two) times daily. Patient not taking: Reported on 12/22/2015 10/22/15 10/21/16  Hinda Kehr, MD  omeprazole (PRILOSEC) 40 MG capsule Take 1 capsule (40 mg total) by mouth daily. 12/17/15   Lucilla Lame, MD  ondansetron (ZOFRAN) 4 MG tablet Take 1 tablet (4 mg total) by mouth every 8 (eight) hours as needed for nausea or vomiting. 03/29/16   Daymon Larsen, MD  oxyCODONE (ROXICODONE) 5 MG immediate release tablet Take 1 tablet (5 mg total) by mouth  every 8 (eight) hours as needed. 07/26/16 07/26/17  Harvest Dark, MD  pantoprazole (PROTONIX) 20 MG tablet Take 1 tablet (20 mg total) by mouth daily. 07/26/16 07/26/17  Harvest Dark, MD  sucralfate (CARAFATE) 1 g tablet Take 1 tablet (1 g total) by mouth 4 (four) times daily. 03/29/16 03/29/17  Daymon Larsen, MD  sulfamethoxazole-trimethoprim (BACTRIM DS,SEPTRA DS) 800-160 MG tablet Take 2 tablets by mouth 2 (two) times daily. Patient not taking: Reported on 10/19/2015 10/08/15   Delman Kitten, MD  tiZANidine (ZANAFLEX) 4 MG tablet Take 1 tablet (4 mg  total) by mouth every 8 (eight) hours as needed for muscle spasms. Patient not taking: Reported on 12/22/2015 11/08/15 11/07/16  Victorino Dike, FNP  traMADol (ULTRAM) 50 MG tablet Take 1 tablet (50 mg total) by mouth every 6 (six) hours as needed. 03/29/16   Daymon Larsen, MD    Allergies  Allergen Reactions  . Tylenol [Acetaminophen]     Pt has hepatitis C     Family History  Problem Relation Age of Onset  . Stroke Mother   . Heart disease Mother   . Arthritis Mother   . Cancer Father     Pancreatic  . Arthritis Daughter     Rheumatoid    Social History Social History  Substance Use Topics  . Smoking status: Current Every Day Smoker    Packs/day: 0.50    Years: 15.00    Types: Cigarettes  . Smokeless tobacco: Never Used  . Alcohol use Yes     Comment: regular (heavy) ETOH consumption-denies alcohol use x1 week on 10/08/15    Review of Systems Constitutional: Negative for fever. Cardiovascular: Negative for chest pain. Respiratory: Negative for shortness of breath. Gastrointestinal: Positive for upper abdominal pain which is fairly chronic for the patient. Negative for vomiting.  Musculoskeletal: Negative for back pain. Neurological: Negative for headache 10-point ROS otherwise negative.  ____________________________________________   PHYSICAL EXAM:  VITAL SIGNS: ED Triage Vitals  Enc Vitals Group     BP 10/17/16 1618 (!) 146/93     Pulse Rate 10/17/16 1618 96     Resp 10/17/16 1618 20     Temp 10/17/16 1618 98.2 F (36.8 C)     Temp Source 10/17/16 1618 Oral     SpO2 10/17/16 1618 96 %     Weight 10/17/16 1617 180 lb (81.6 kg)     Height --      Head Circumference --      Peak Flow --      Pain Score 10/17/16 1617 10     Pain Loc --      Pain Edu? --      Excl. in [place]? --     Constitutional: Alert and oriented. Well appearing and in no distress. Eyes: Normal exam ENT   Head: Normocephalic and atraumatic.   Mouth/Throat: Mucous membranes  are moist. Cardiovascular: Normal rate, regular rhythm. No murmur Respiratory: Normal respiratory effort without tachypnea nor retractions. Breath sounds are clear Gastrointestinal: Soft and nontender. No distention. Musculoskeletal: Nontender with normal range of motion in all extremities.  Neurologic:  Normal speech and language. No gross focal neurologic deficits  Skin:  Skin is warm, dry and intact.  Psychiatric: Mood and affect are normal.  ____________________________________________   INITIAL IMPRESSION / ASSESSMENT AND PLAN / ED COURSE  Pertinent labs & imaging results that were available during my care of the patient were reviewed by me and considered in my medical decision making (see  chart for details).  Patient presents the emergency department with 2 complaints, the first of which is upper abdominal pain which is fairly chronic for the patient but worse over the past few weeks. Has an appointment with GI medicine in 2 days. Patient has a largely nontender abdominal exam. Labs are within normal limits including LFTs, lipase and white blood cell count. As a secondary complaint the patient states his wife is recently diagnosed with trichomoniasis and he is requesting treatment. The patient's wife's other STD testing has resulted negative. We will treat the patient Flagyl twice a day for the next 7 days. Patient's labs are largely reassuring with a nontender abdominal exam. Patient has a history of gastritis and pancreatitis. No signs of acute pancreatitis with a normal lipase. We will dose of GI cocktail in the emergency department. Discharged with a short course of pain control as the patient has a follow-up appointment with GI medicine in 2 days.  ____________________________________________   FINAL CLINICAL IMPRESSION(S) / ED DIAGNOSES  Upper abdominal pain STD treatment    Harvest Dark, MD 10/17/16 [phone]

## 2016-10-19 ENCOUNTER — Ambulatory Visit (INDEPENDENT_AMBULATORY_CARE_PROVIDER_SITE_OTHER): Payer: Medicaid Other | Admitting: Gastroenterology

## 2016-10-19 ENCOUNTER — Encounter: Payer: Self-pay | Admitting: Gastroenterology

## 2016-10-19 VITALS — BP 152/100 | HR 95 | Temp 98.4°F | Ht >= 80 in | Wt 205.0 lb

## 2016-10-19 DIAGNOSIS — B182 Chronic viral hepatitis C: Secondary | ICD-10-CM | POA: Diagnosis not present

## 2016-10-19 MED ORDER — PANTOPRAZOLE SODIUM 40 MG PO TBEC: 40.0000 mg | DELAYED_RELEASE_TABLET | Freq: Every day | ORAL | 11 refills | Status: DC

## 2016-10-19 NOTE — Progress Notes (Signed)
   Primary Care Physician: [place]  Primary Gastroenterologist:  Dr. Lucilla Lame  Chief Complaint  Patient presents with  . Other    Established Patient-Hx of Hep C    HPI: Larry Parrish is a 40 y.o. male here For follow-up of his hepatitis C and epigastric pain. The patient was found to have gastritis at his EGD. The patient states he is in the emergency room for the gastritis yesterday. The patient was not treated in the past because his fibrosis score was too low but now is a candidate for treatment. The patient has been engaged in cocaine use recently.  Current Outpatient Prescriptions  Medication Sig Dispense Refill  . metroNIDAZOLE (FLAGYL) 500 MG tablet Take 1 tablet (500 mg total) by mouth 3 (three) times daily. 14 tablet 0  . traMADol (ULTRAM) 50 MG tablet Take 1 tablet (50 mg total) by mouth every 6 (six) hours as needed. 20 tablet 0  . pantoprazole (PROTONIX) 40 MG tablet Take 1 tablet (40 mg total) by mouth daily. 30 tablet 11  . sucralfate (CARAFATE) 1 g tablet Take 1 tablet (1 g total) by mouth 4 (four) times daily. 120 tablet 1   No current facility-administered medications for this visit.     Allergies as of 10/19/2016 - Review Complete 10/19/2016  Allergen Reaction Noted  . Tylenol [acetaminophen]  07/05/2016    ROS:  General: Negative for anorexia, weight loss, fever, chills, fatigue, weakness. ENT: Negative for hoarseness, difficulty swallowing , nasal congestion. CV: Negative for chest pain, angina, palpitations, dyspnea on exertion, peripheral edema.  Respiratory: Negative for dyspnea at rest, dyspnea on exertion, cough, sputum, wheezing.  GI: See history of present illness. GU:  Negative for dysuria, hematuria, urinary incontinence, urinary frequency, nocturnal urination.  Endo: Negative for unusual weight change.    Physical Examination:   BP (!) 152/100   Pulse 95   Temp 98.4 F (36.9 C) (Oral)   Ht 7\' 5"  (2.261 m)   Wt 205 lb (93  kg)   BMI 18.20 kg/m   General: Well-nourished, well-developed in no acute distress.  Eyes: No icterus. Conjunctivae pink. Extremities: No lower extremity edema. No clubbing or deformities. Neuro: Alert and oriented x 3.  Grossly intact. Skin: Warm and dry, no jaundice.   Psych: Alert and cooperative, normal mood and affect.  Labs:    Imaging Studies: No results found.  Assessment and Plan:   [name] is a 40 y.o. y/o male who comes in today with a history of hepatitis C and epigastric pain. The patient has a history of gastritis. The patient will be given samples of Dexilant and he'll be started on a prescription of Protonix. The patient has also been told that he has to be drug-free before we will start him on medication for his hepatitis C. The patient will follow-up in 6 months whereupon he will have labs repeated in evaluated for starting the treatment for his hepatitis C. The patient has been explained the plan and agrees with it.    Lucilla Lame, MD. [name]   Note: This dictation was prepared with Dragon dictation along with smaller phrase technology. Any transcriptional errors that result from this process are unintentional.

## 2017-02-08 IMAGING — US US ABDOMEN LIMITED
1 series · 14 of 25 positions shown · non-contrast
Comparison: CT abdomen and pelvis 11/13/2014

CLINICAL DATA: Right upper quadrant pain for 5 days. Patient
recently ate and drank.

EXAM:
US ABDOMEN LIMITED - RIGHT UPPER QUADRANT

[Series 1: us abdomen limited · 0.21mm/px · 14 of 88 slices shown]
[im 1/88]
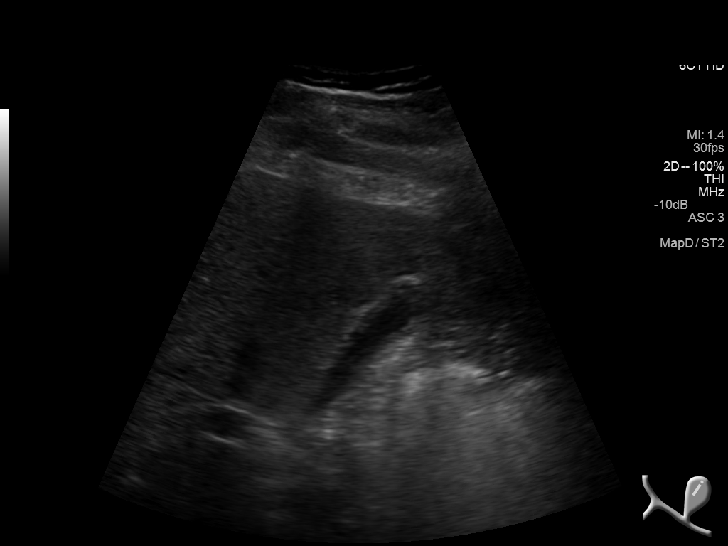
[im 8/88]
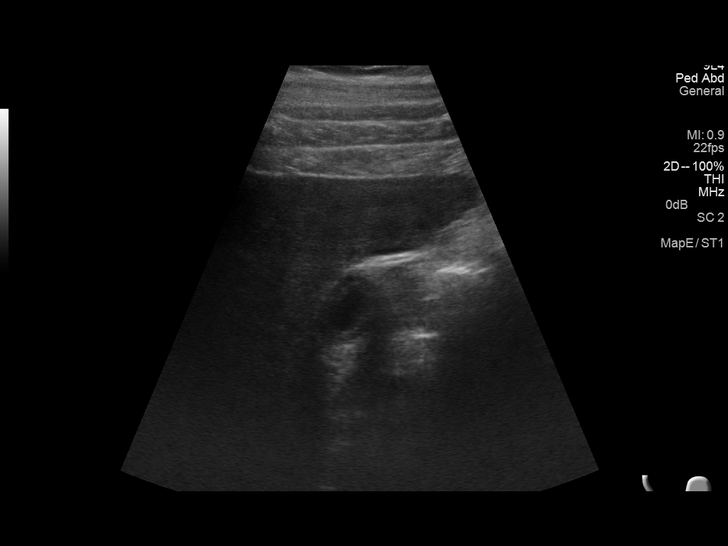
[im 15/88]
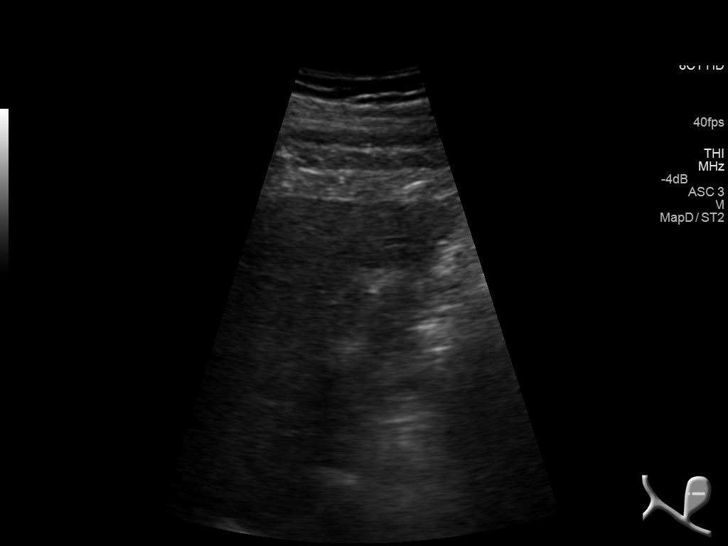
[im 22/88]
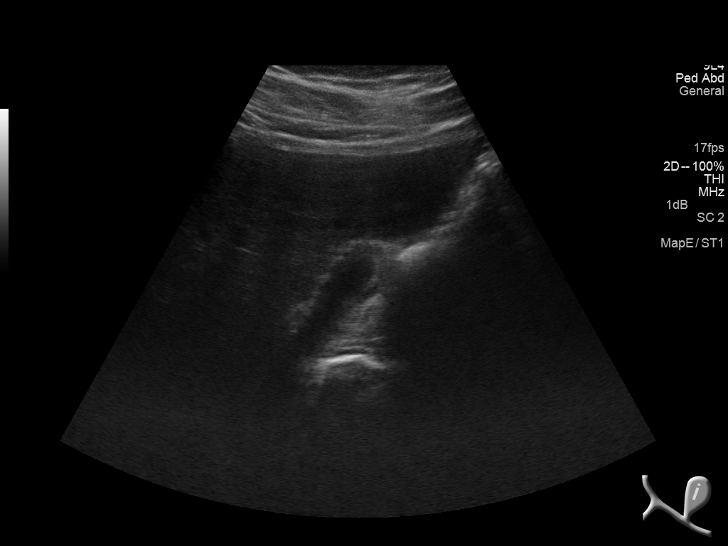
[im 30/88]
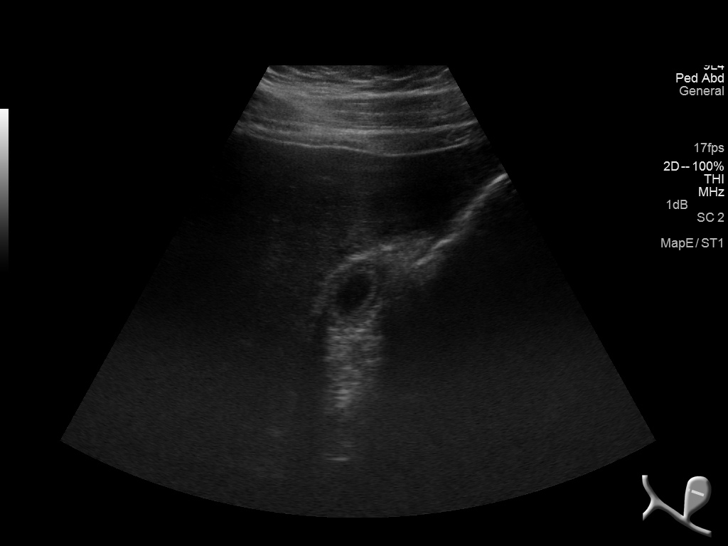
[im 33/88]
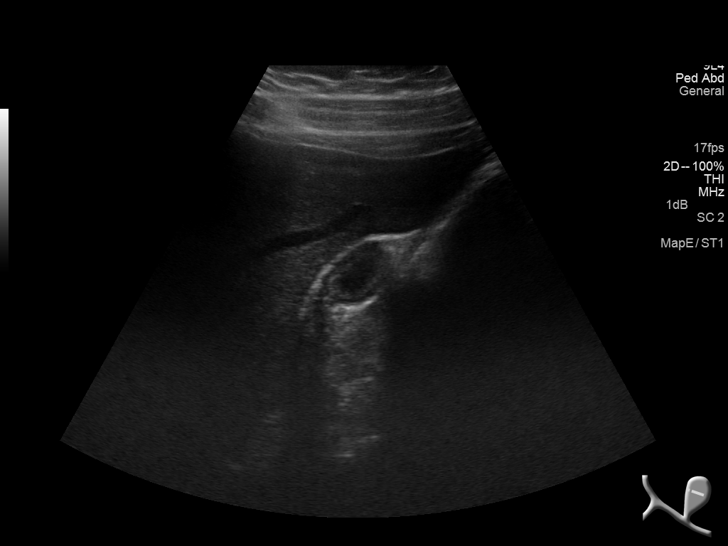
[im 40/88]
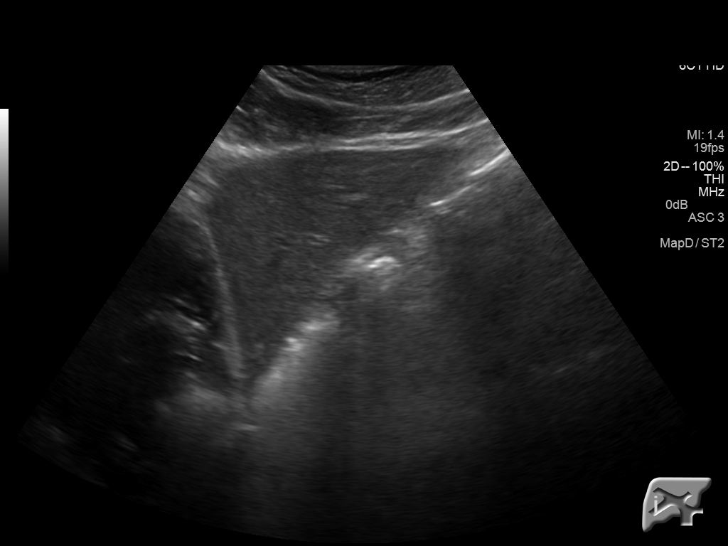
[im 48/88]
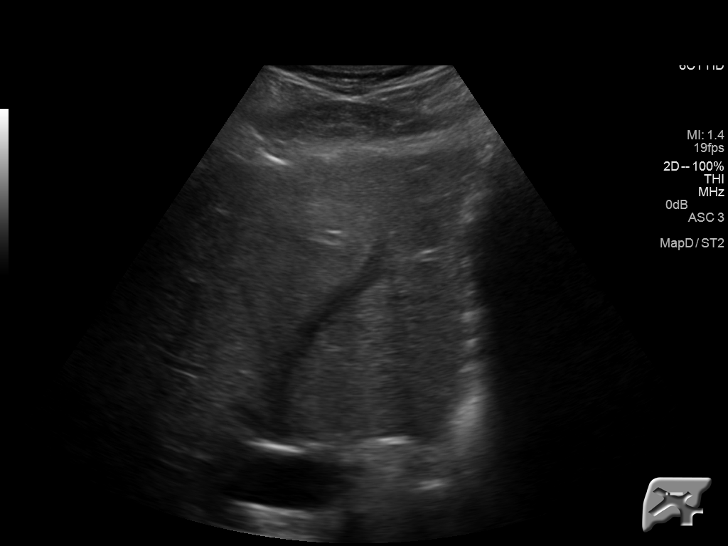
[im 55/88]
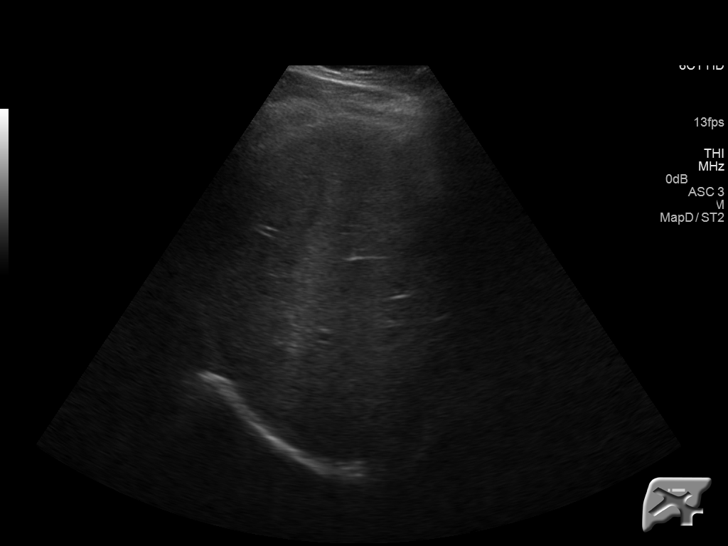
[im 59/88]
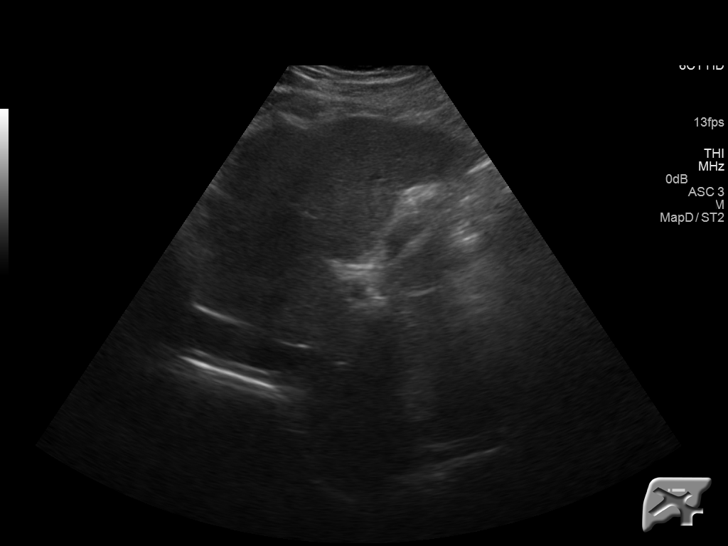
[im 66/88]
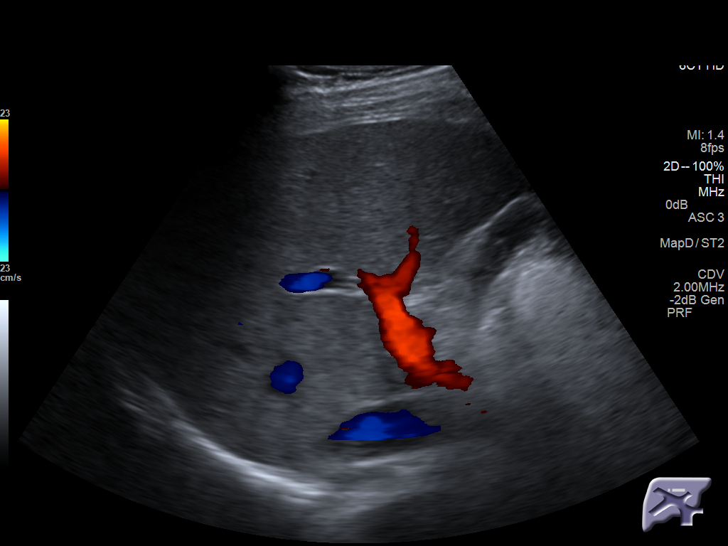
[im 73/88]
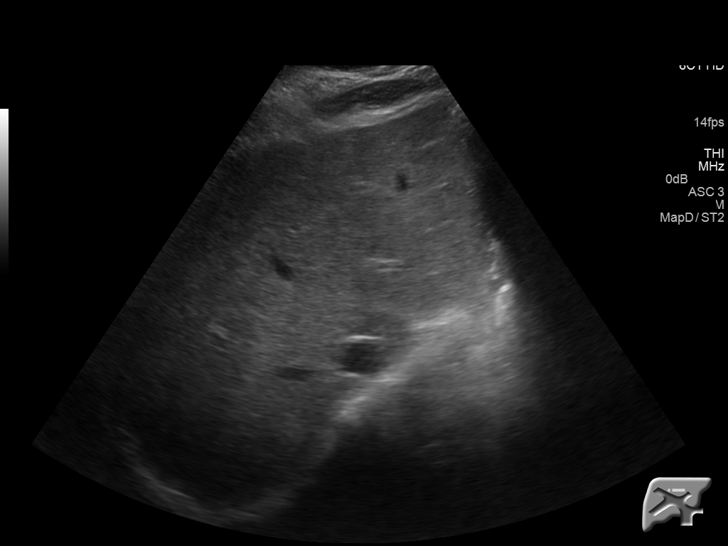
[im 80/88]
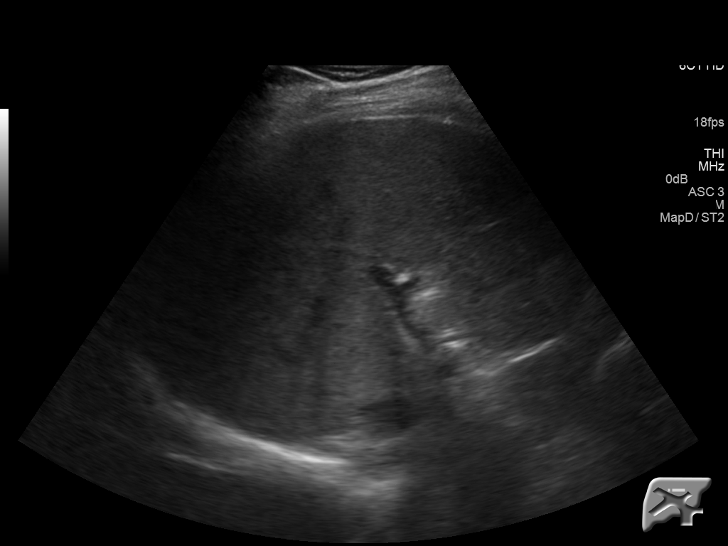
[im 88/88]
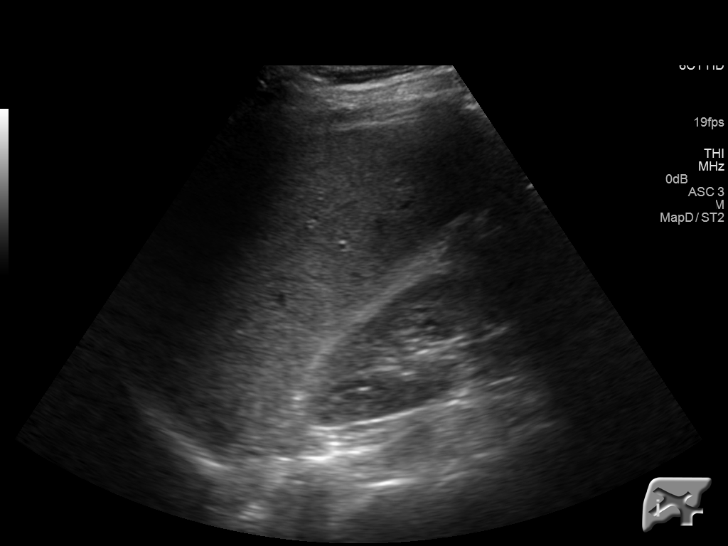

[14 of 25 positions shown; findings below may reference images not displayed]

FINDINGS: Gallbladder:

Gallbladder is contracted, likely physiologic in the nonfasting
state. No stones or sludge appreciated. Gallbladder wall thickness
is upper limits of normal, likely due to physiologic contraction.
Murphy's sign is negative.

Common bile duct:

Diameter: 3.9 mm, normal

Liver:

Visualization is somewhat limited due to rib artifact. Suggestion of
mild diffusely increased echotexture suggesting fatty infiltration.
No focal liver lesions identified.
IMPRESSION: Probable physiologic contraction of the gallbladder. No evidence
suggesting cholelithiasis or cholecystitis. Probable mild fatty
infiltration of the liver.

## 2017-02-16 ENCOUNTER — Emergency Department
Admission: EM | Admit: 2017-02-16 | Discharge: 2017-02-16 | Disposition: A | Discharge status: Home / Self Care | Payer: Self-pay | Attending: Emergency Medicine | Admitting: Emergency Medicine

## 2017-02-16 ENCOUNTER — Emergency Department: Payer: Self-pay

## 2017-02-16 ENCOUNTER — Encounter: Payer: Self-pay | Admitting: Emergency Medicine

## 2017-02-16 DIAGNOSIS — F17210 Nicotine dependence, cigarettes, uncomplicated: Secondary | ICD-10-CM | POA: Insufficient documentation

## 2017-02-16 DIAGNOSIS — R1012 Left upper quadrant pain: Secondary | ICD-10-CM | POA: Insufficient documentation

## 2017-02-16 DIAGNOSIS — Z79899 Other long term (current) drug therapy: Secondary | ICD-10-CM | POA: Insufficient documentation

## 2017-02-16 LAB — CBC WITH DIFFERENTIAL/PLATELET
BASOS ABS: 0.1 10*3/uL (ref 0–0.1)
Basophils Relative: 1 %
EOS PCT: 2 %
Eosinophils Absolute: 0.1 10*3/uL (ref 0–0.7)
HEMATOCRIT: 43.5 % (ref 40.0–52.0)
Hemoglobin: 15.1 g/dL (ref 13.0–18.0)
LYMPHS ABS: 2.3 10*3/uL (ref 1.0–3.6)
LYMPHS PCT: 31 %
MCH: 32.3 pg (ref 26.0–34.0)
MCHC: 34.6 g/dL (ref 32.0–36.0)
MCV: 93.5 fL (ref 80.0–100.0)
MONO ABS: 0.8 10*3/uL (ref 0.2–1.0)
MONOS PCT: 11 %
NEUTROS ABS: 4.2 10*3/uL (ref 1.4–6.5)
Neutrophils Relative %: 55 %
PLATELETS: 267 10*3/uL (ref 150–440)
RBC: 4.65 MIL/uL (ref 4.40–5.90)
RDW: 12.8 % (ref 11.5–14.5)
WBC: 7.5 10*3/uL (ref 3.8–10.6)

## 2017-02-16 LAB — COMPREHENSIVE METABOLIC PANEL
ALT: 44 U/L (ref 17–63)
AST: 48 U/L — AB (ref 15–41)
Albumin: 4.2 g/dL (ref 3.5–5.0)
Alkaline Phosphatase: 52 U/L (ref 38–126)
Anion gap: 8 (ref 5–15)
BILIRUBIN TOTAL: 0.9 mg/dL (ref 0.3–1.2)
BUN: 15 mg/dL (ref 6–20)
CHLORIDE: 102 mmol/L (ref 101–111)
CO2: 27 mmol/L (ref 22–32)
CREATININE: 0.98 mg/dL (ref 0.61–1.24)
Calcium: 9.3 mg/dL (ref 8.9–10.3)
GFR calc Af Amer: >60 mL/min (ref >60)
Glucose, Bld: 129 mg/dL — ABNORMAL HIGH (ref 65–99)
POTASSIUM: 4 mmol/L (ref 3.5–5.1)
Sodium: 137 mmol/L (ref 135–145)
TOTAL PROTEIN: 7.5 g/dL (ref 6.5–8.1)

## 2017-02-16 LAB — URINALYSIS, COMPLETE (UACMP) WITH MICROSCOPIC
Bacteria, UA: NONE SEEN
Bilirubin Urine: NEGATIVE
GLUCOSE, UA: NEGATIVE mg/dL
HGB URINE DIPSTICK: NEGATIVE
Ketones, ur: NEGATIVE mg/dL
Leukocytes, UA: NEGATIVE
Nitrite: NEGATIVE
PH: 6 (ref 5.0–8.0)
Protein, ur: NEGATIVE mg/dL
RBC / HPF: NONE SEEN RBC/hpf (ref 0–5)
SPECIFIC GRAVITY, URINE: 1.013 (ref 1.005–1.030)
SQUAMOUS EPITHELIAL / LPF: NONE SEEN
WBC, UA: NONE SEEN WBC/hpf (ref 0–5)

## 2017-02-16 LAB — URINE DRUG SCREEN, QUALITATIVE (ARMC ONLY)
Amphetamines, Ur Screen: NOT DETECTED
BARBITURATES, UR SCREEN: NOT DETECTED
BENZODIAZEPINE, UR SCRN: NOT DETECTED
Cannabinoid 50 Ng, Ur ~~LOC~~: NOT DETECTED
Cocaine Metabolite,Ur ~~LOC~~: POSITIVE — AB
MDMA (Ecstasy)Ur Screen: NOT DETECTED
Methadone Scn, Ur: NOT DETECTED
OPIATE, UR SCREEN: NOT DETECTED
PHENCYCLIDINE (PCP) UR S: NOT DETECTED
Tricyclic, Ur Screen: NOT DETECTED

## 2017-02-16 LAB — LIPASE, BLOOD: Lipase: 16 U/L (ref 11–51)

## 2017-02-16 LAB — ETHANOL

## 2017-02-16 MED ORDER — ONDANSETRON HCL 4 MG/2ML IJ SOLN
4.0000 mg | Freq: Once | INTRAMUSCULAR | Status: AC
  Administered 2017-02-16: 4 mg via INTRAVENOUS
  Filled 2017-02-16: qty 2

## 2017-02-16 MED ORDER — IOPAMIDOL (ISOVUE-300) INJECTION 61%
30.0000 mL | Freq: Once | INTRAVENOUS | Status: AC | PRN
  Administered 2017-02-16: 30 mL via ORAL

## 2017-02-16 MED ORDER — FAMOTIDINE IN NACL 20-0.9 MG/50ML-% IV SOLN
20.0000 mg | Freq: Once | INTRAVENOUS | Status: AC
  Administered 2017-02-16: 20 mg via INTRAVENOUS
  Filled 2017-02-16: qty 50

## 2017-02-16 MED ORDER — IOPAMIDOL (ISOVUE-300) INJECTION 61%
100.0000 mL | Freq: Once | INTRAVENOUS | Status: AC | PRN
  Administered 2017-02-16: 100 mL via INTRAVENOUS

## 2017-02-16 NOTE — Discharge Instructions (Signed)
Please follow-up with your gastroenterologist within 1 week for recheck. Please refrain from using cocaine. Return to the emergency department for any concerns such as worsening pain if you cannot eat or drink or for any other concerns.  It was a pleasure to take care of you today, and thank you for coming to our emergency department.  If you have any questions or concerns before leaving please ask the nurse to grab me and I'm more than happy to go through your aftercare instructions again.  If you were prescribed any opioid pain medication today such as Norco, Vicodin, Percocet, morphine, hydrocodone, or oxycodone please make sure you do not drive when you are taking this medication as it can alter your ability to drive safely.  If you have any concerns once you are home that you are not improving or are in fact getting worse before you can make it to your follow-up appointment, please do not hesitate to call [phone] and come back for further evaluation.  Darel Hong MD  Results for orders placed or performed during the hospital encounter of 02/16/17  CBC with Differential  Result Value Ref Range   WBC 7.5 3.8 - 10.6 K/uL   RBC 4.65 4.40 - 5.90 MIL/uL   Hemoglobin 15.1 13.0 - 18.0 g/dL   HCT 43.5 40.0 - 52.0 %   MCV 93.5 80.0 - 100.0 fL   MCH 32.3 26.0 - 34.0 pg   MCHC 34.6 32.0 - 36.0 g/dL   RDW 12.8 11.5 - 14.5 %   Platelets 267 150 - 440 K/uL   Neutrophils Relative % 55 %   Neutro Abs 4.2 1.4 - 6.5 K/uL   Lymphocytes Relative 31 %   Lymphs Abs 2.3 1.0 - 3.6 K/uL   Monocytes Relative 11 %   Monocytes Absolute 0.8 0.2 - 1.0 K/uL   Eosinophils Relative 2 %   Eosinophils Absolute 0.1 0 - 0.7 K/uL   Basophils Relative 1 %   Basophils Absolute 0.1 0 - 0.1 K/uL  Comprehensive metabolic panel  Result Value Ref Range   Sodium 137 135 - 145 mmol/L   Potassium 4.0 3.5 - 5.1 mmol/L   Chloride 102 101 - 111 mmol/L   CO2 27 22 - 32 mmol/L   Glucose, Bld 129 (H) 65 - 99 mg/dL   BUN 15 6 -  20 mg/dL   Creatinine, Ser 0.98 0.61 - 1.24 mg/dL   Calcium 9.3 8.9 - 10.3 mg/dL   Total Protein 7.5 6.5 - 8.1 g/dL   Albumin 4.2 3.5 - 5.0 g/dL   AST 48 (H) 15 - 41 U/L   ALT 44 17 - 63 U/L   Alkaline Phosphatase 52 38 - 126 U/L   Total Bilirubin 0.9 0.3 - 1.2 mg/dL   GFR calc non Af Amer >60 >60 mL/min   GFR calc Af Amer >60 >60 mL/min   Anion gap 8 5 - 15  Ethanol  Result Value Ref Range   Alcohol, Ethyl (B) <5 <5 mg/dL  Lipase, blood  Result Value Ref Range   Lipase 16 11 - 51 U/L  Urine Drug Screen, Qualitative  Result Value Ref Range   Tricyclic, Ur Screen NONE DETECTED NONE DETECTED   Amphetamines, Ur Screen NONE DETECTED NONE DETECTED   MDMA (Ecstasy)Ur Screen NONE DETECTED NONE DETECTED   Cocaine Metabolite,Ur [place] POSITIVE (A) NONE DETECTED   Opiate, Ur Screen NONE DETECTED NONE DETECTED   Phencyclidine (PCP) Ur S NONE DETECTED NONE DETECTED   Cannabinoid  34 Ng, Ur [place] NONE DETECTED NONE DETECTED   Barbiturates, Ur Screen NONE DETECTED NONE DETECTED   Benzodiazepine, Ur Scrn NONE DETECTED NONE DETECTED   Methadone Scn, Ur NONE DETECTED NONE DETECTED  Urinalysis, Complete w Microscopic  Result Value Ref Range   Color, Urine YELLOW (A) YELLOW   APPearance CLEAR (A) CLEAR   Specific Gravity, Urine 1.013 1.005 - 1.030   pH 6.0 5.0 - 8.0   Glucose, UA NEGATIVE NEGATIVE mg/dL   Hgb urine dipstick NEGATIVE NEGATIVE   Bilirubin Urine NEGATIVE NEGATIVE   Ketones, ur NEGATIVE NEGATIVE mg/dL   Protein, ur NEGATIVE NEGATIVE mg/dL   Nitrite NEGATIVE NEGATIVE   Leukocytes, UA NEGATIVE NEGATIVE   RBC / HPF NONE SEEN 0 - 5 RBC/hpf   WBC, UA NONE SEEN 0 - 5 WBC/hpf   Bacteria, UA NONE SEEN NONE SEEN   Squamous Epithelial / LPF NONE SEEN NONE SEEN   Ct Abdomen Pelvis W Contrast  Result Date: 02/16/2017 CLINICAL DATA:  By all pain, primarily left-sided EXAM: CT ABDOMEN AND PELVIS WITH CONTRAST TECHNIQUE: Multidetector CT imaging of the abdomen and pelvis was performed using the  standard protocol following bolus administration of intravenous contrast. Oral contrast was also administered. CONTRAST:  131mL ISOVUE-300 IOPAMIDOL (ISOVUE-300) INJECTION 61% COMPARISON:  Mar 29, 2016 FINDINGS: Lower chest: Lung bases are clear. There is a small hiatal hernia. There is oral contrast seen in the distal esophagus. Hepatobiliary: There is hepatic steatosis. No focal liver lesions are apparent. Gallbladder is contracted. There is no pericholecystic fluid. There is no biliary duct dilatation. Pancreas: There is no pancreatic mass or inflammatory focus. Spleen: No splenic lesions are evident. Adrenals/Urinary Tract: Adrenals appear unremarkable bilaterally. Kidneys bilaterally show no evident mass or hydronephrosis on either side. There is no renal or ureteral calculus on either side. Urinary bladder appears somewhat distended. Urinary bladder is midline with wall thickness within normal limits. Stomach/Bowel: Rectum is slightly distended with air. There is no rectal wall thickening. There is no appreciable bowel wall or mesenteric thickening. Stomach is mildly distended with food material and fluid. There is no evident bowel obstruction. No free air or portal venous air. Vascular/Lymphatic: There is no abdominal aortic aneurysm. No vascular lesions are appreciable. There is no appreciable adenopathy in the abdomen or pelvis. Reproductive: Prostate and seminal vesicles appear normal in size and contour. No pelvic mass evident. Other: Appendix appears normal. No ascites or abscess evident in the abdomen or pelvis. Musculoskeletal: There is degenerative change in the lower lumbar spine. There are no blastic or lytic bone lesions. There is no intramuscular or abdominal wall lesion. IMPRESSION: Small hiatal hernia with oral contrast seen in the distal esophagus consistent with a degree of gastroesophageal reflux. Stomach distended with food and fluid.  No gastric wall thickening. No bowel obstruction or  bowel wall thickening. No abscess. Appendix appears normal. No renal or ureteral calculus. No hydronephrosis. Urinary bladder is somewhat distended with wall thickness within normal limits. Hepatic steatosis. Electronically Signed   By: Lowella Grip III M.D.   On: 02/16/2017 07:36

## 2017-02-16 NOTE — ED Provider Notes (Signed)
Care signed over from Dr. Beather Arbour pending CT scan. Briefly the patient is a 41 year old man with a long-standing history of gastric reflux who comes to the emergency department with left upper quadrant pain for the last several weeks. 2 months ago he stopped taking his when necessary. Fortunately today his CT scan is negative for acute pathology aside from hiatal hernia and likely reflux. I've encouraged him to restart his PPI and he says he does not need any prescriptions at this point he can follow up with his gastroenterologist. He is discharged home in good condition.   Darel Hong, MD 02/16/17 0830

## 2017-02-16 NOTE — ED Provider Notes (Addendum)
[facility] Emergency Department Provider Note   ____________________________________________   First MD Initiated Contact with Patient 02/16/17 [phone]     (approximate)  I have reviewed the triage vital signs and the nursing notes.   HISTORY  Chief Complaint Abdominal Pain    HPI Larry Parrish is a 41 y.o. male who presents to the ED from home with a chief complaint of abdominal pain. Patient has a history of hepatitis C, alcohol abuse, IV drug use who complains of left upper quadrant abdominal pain constantly for the past 2 weeks. Pain increased 3 days ago while lifting basketball goal. Describes aching type pain sometimes with burning to left upper quadrant which now radiates to the umbilicus and left flank. Denies associated fever, chills, chest pain, shortness of breath, nausea, vomiting, dysuria, diarrhea. Patient take Metamucil last week thinking he was constipated and has been having frequent daily bowel movement since. Food does not affect pain. Denies recent travel or trauma.   Past Medical History:  Diagnosis Date  . GI bleed   . Hepatitis C   . History of pancreatitis     Patient Active Problem List   Diagnosis Date Noted  . Abdominal pain, epigastric   . Reflux esophagitis   . Gastritis   . Abdominal pain, chronic, epigastric 10/13/2015  . Abscess of left arm   . Chronic hepatitis C without hepatic coma ([place])   . Drug abuse   . AA (alcohol abuse) 11/16/2014  . Hepatitis 09/28/2014  . HCV (hepatitis C virus) 08/12/2014  . Intravenous drug user 07/30/2014    Past Surgical History:  Procedure Laterality Date  . ESOPHAGOGASTRODUODENOSCOPY (EGD) WITH PROPOFOL N/A 10/21/2015   Procedure: ESOPHAGOGASTRODUODENOSCOPY (EGD) WITH PROPOFOL;  Surgeon: Lucilla Lame, MD;  Location: [place];  Service: Endoscopy;  Laterality: N/A;  . HAND SURGERY Left 2008  . I&D EXTREMITY Left 10/08/2015   Arm  . TOOTH EXTRACTION      Prior to  Admission medications   Medication Sig Start Date End Date Taking? Authorizing Provider  pantoprazole (PROTONIX) 40 MG tablet Take 1 tablet (40 mg total) by mouth daily. 10/19/16   Lucilla Lame, MD  sucralfate (CARAFATE) 1 g tablet Take 1 tablet (1 g total) by mouth 4 (four) times daily. 03/29/16 03/29/17  Daymon Larsen, MD  traMADol (ULTRAM) 50 MG tablet Take 1 tablet (50 mg total) by mouth every 6 (six) hours as needed. 10/17/16 10/17/17  Harvest Dark, MD    Allergies Tylenol [acetaminophen]  Family History  Problem Relation Age of Onset  . Stroke Mother   . Heart disease Mother   . Arthritis Mother   . Cancer Father     Pancreatic  . Arthritis Daughter     Rheumatoid    Social History Social History  Substance Use Topics  . Smoking status: Current Every Day Smoker    Packs/day: 0.50    Years: 15.00    Types: Cigarettes  . Smokeless tobacco: Never Used  . Alcohol use Yes     Comment: regular (heavy) ETOH consumption-denies alcohol use x1 week on 10/08/15    Review of Systems  Constitutional: No fever/chills. Eyes: No visual changes. ENT: No sore throat. Cardiovascular: Denies chest pain. Respiratory: Denies shortness of breath. Gastrointestinal: Positive for abdominal pain.  No nausea, no vomiting.  No diarrhea.  No constipation. Genitourinary: Negative for dysuria. Musculoskeletal: Negative for back pain. Skin: Negative for rash. Neurological: Negative for headaches, focal weakness or numbness.  10-point  ROS otherwise negative.  ____________________________________________   PHYSICAL EXAM:  VITAL SIGNS: ED Triage Vitals [02/16/17 0539]  Enc Vitals Group     BP 135/85     Pulse Rate 91     Resp 20     Temp 97.9 F (36.6 C)     Temp Source Oral     SpO2 98 %     Weight 185 lb (83.9 kg)     Height 6\' 1"  (1.854 m)     Head Circumference      Peak Flow      Pain Score 8     Pain Loc      Pain Edu?      Excl. in [place]?     Constitutional: Alert and  oriented. Well appearing and in no acute distress. Eyes: Conjunctivae are normal. PERRL. EOMI. Head: Atraumatic. Nose: No congestion/rhinnorhea. Mouth/Throat: Mucous membranes are moist.  Oropharynx non-erythematous. Neck: No stridor.   Cardiovascular: Normal rate, regular rhythm. Grossly normal heart sounds.  Good peripheral circulation. Respiratory: Normal respiratory effort.  No retractions. Lungs CTAB. Gastrointestinal: Soft and mildly tender to palpation left upper quadrant, epigastrium and umbilicus without rebound or guarding. No distention. No palpable hernias. No abdominal bruits. No CVA tenderness. Musculoskeletal: No lower extremity tenderness nor edema.  No joint effusions. Neurologic:  Normal speech and language. No gross focal neurologic deficits are appreciated. No gait instability. Skin:  Skin is warm, dry and intact. No rash noted. Psychiatric: Mood and affect are normal. Speech and behavior are normal.  ____________________________________________   LABS (all labs ordered are listed, but only abnormal results are displayed)  Labs Reviewed  COMPREHENSIVE METABOLIC PANEL - Abnormal; Notable for the following:       Result Value   Glucose, Bld 129 (*)    AST 48 (*)    All other components within normal limits  CBC WITH DIFFERENTIAL/PLATELET  ETHANOL  LIPASE, BLOOD  URINE DRUG SCREEN, QUALITATIVE ([facility] ONLY)  URINALYSIS, COMPLETE ([facility]) WITH MICROSCOPIC   ____________________________________________  EKG  None ____________________________________________  RADIOLOGY  Pending ____________________________________________   PROCEDURES  Procedure(s) performed: None  Procedures  Critical Care performed: No  ____________________________________________   INITIAL IMPRESSION / ASSESSMENT AND PLAN / ED COURSE  Pertinent labs & imaging results that were available during my care of the patient were reviewed by me and considered in my medical decision making  (see chart for details).  41 year old male with history of hep C who presents with a two-week history of constant left upper quadrant pain, now radiating to his left flank and umbilicus. CBC is unremarkable. Awaiting results of other laboratory tests as well as urine tests. CT scan ordered to evaluate intra-abdominal etiology. Pepcid given as patient has had gastritis previously. Care will be transferred to oncoming provider pending results of lab work and CT scan.  Clinical Course as of Feb 16 657  Fri Feb 16, 2017  0657 LFTs, lipase and EtOH results noted. Patient starting to work on oral contrast in preparation for CT scan. Urinalysis and CT results are pending. Care transferred to Dr. Mable Paris.  [JS]    Clinical Course User Index [JS] Paulette Blanch, MD     ____________________________________________   FINAL CLINICAL IMPRESSION(S) / ED DIAGNOSES  Final diagnoses:  Left upper quadrant pain      NEW MEDICATIONS STARTED DURING THIS VISIT:  New Prescriptions   No medications on file     Note:  This document was prepared using Dragon voice recognition software and  may include unintentional dictation errors.    Paulette Blanch, MD 02/16/17 [place], MD 02/16/17 [phone]

## 2017-02-16 NOTE — ED Triage Notes (Signed)
Patient ambulatory to triage with steady gait, without difficulty or distress noted; pt reports x 2wks, left sided abd pain that radiates around into back, denies any urinary c/o; hx Hep C; denies hx of same

## 2017-06-15 ENCOUNTER — Emergency Department
Admission: EM | Admit: 2017-06-15 | Discharge: 2017-06-15 | Disposition: A | Discharge status: Home / Self Care | Payer: Medicaid Other | Attending: Emergency Medicine | Admitting: Emergency Medicine

## 2017-06-15 DIAGNOSIS — T43621A Poisoning by amphetamines, accidental (unintentional), initial encounter: Secondary | ICD-10-CM | POA: Insufficient documentation

## 2017-06-15 DIAGNOSIS — F17210 Nicotine dependence, cigarettes, uncomplicated: Secondary | ICD-10-CM | POA: Insufficient documentation

## 2017-06-15 DIAGNOSIS — B1710 Acute hepatitis C without hepatic coma: Secondary | ICD-10-CM | POA: Insufficient documentation

## 2017-06-15 DIAGNOSIS — T50901A Poisoning by unspecified drugs, medicaments and biological substances, accidental (unintentional), initial encounter: Secondary | ICD-10-CM

## 2017-06-15 LAB — CBC
HCT: 39.9 % — ABNORMAL LOW (ref 40.0–52.0)
Hemoglobin: 14.1 g/dL (ref 13.0–18.0)
MCH: 32.1 pg (ref 26.0–34.0)
MCHC: 35.3 g/dL (ref 32.0–36.0)
MCV: 91.1 fL (ref 80.0–100.0)
PLATELETS: 242 10*3/uL (ref 150–440)
RBC: 4.38 MIL/uL — AB (ref 4.40–5.90)
RDW: 12.5 % (ref 11.5–14.5)
WBC: 6.9 10*3/uL (ref 3.8–10.6)

## 2017-06-15 LAB — BASIC METABOLIC PANEL
Anion gap: 8 (ref 5–15)
BUN: 19 mg/dL (ref 6–20)
CALCIUM: 9.3 mg/dL (ref 8.9–10.3)
CO2: 29 mmol/L (ref 22–32)
CREATININE: 1.27 mg/dL — AB (ref 0.61–1.24)
Chloride: 101 mmol/L (ref 101–111)
GFR calc Af Amer: >60 mL/min (ref >60)
Glucose, Bld: 121 mg/dL — ABNORMAL HIGH (ref 65–99)
POTASSIUM: 3.7 mmol/L (ref 3.5–5.1)
SODIUM: 138 mmol/L (ref 135–145)

## 2017-06-15 MED ORDER — SODIUM CHLORIDE 0.9 % IV BOLUS (SEPSIS)
1000.0000 mL | Freq: Once | INTRAVENOUS | Status: AC
  Administered 2017-06-15: 1000 mL via INTRAVENOUS

## 2017-06-15 MED ORDER — NALOXONE HCL 2 MG/2ML IJ SOSY
2.0000 mg | PREFILLED_SYRINGE | Freq: Once | INTRAMUSCULAR | Status: AC
  Administered 2017-06-15: 2 mg via INTRAVENOUS

## 2017-06-15 MED ORDER — ONDANSETRON HCL 4 MG/2ML IJ SOLN
INTRAMUSCULAR | Status: AC
  Administered 2017-06-15: 4 mg via INTRAVENOUS
  Filled 2017-06-15: qty 2

## 2017-06-15 MED ORDER — ONDANSETRON HCL 4 MG/2ML IJ SOLN
4.0000 mg | Freq: Once | INTRAMUSCULAR | Status: AC
  Administered 2017-06-15: 4 mg via INTRAVENOUS

## 2017-06-15 NOTE — ED Notes (Signed)
Pt reports used meth and suboxone. Denies any other drug use or alcohol.

## 2017-06-15 NOTE — ED Notes (Signed)
Pt c/o pain to right arm where IV is located. IV removed.

## 2017-06-15 NOTE — ED Notes (Signed)
Pt alert and oriented, girlfriend at bedside.

## 2017-06-15 NOTE — ED Provider Notes (Signed)
[facility] Emergency Department Provider Note  Time seen: 5:15 PM  I have reviewed the triage vital signs and the nursing notes.   HISTORY  Chief Complaint Drug Overdose    HPI Larry Parrish is a 41 y.o. male with a past medical history of hepatitis C, drug abuse, presents to the emergency department unresponsive. According to nurse report the patient was dropped off and from the emergency room by a Lucianne Lei which than left before anyone could question them. Patient was picked up off the sidewalk and brought into the emergency department. Patient is awake, eyes are open but is not responding, is breathing on his own, with a normal pulse rate. Patient is not following commands, not answering questions. Positive corneal reflexes. Pupils are 2-3 mm.  Past Medical History:  Diagnosis Date  . GI bleed   . Hepatitis C   . History of pancreatitis     Patient Active Problem List   Diagnosis Date Noted  . Abdominal pain, epigastric   . Reflux esophagitis   . Gastritis   . Abdominal pain, chronic, epigastric 10/13/2015  . Abscess of left arm   . Chronic hepatitis C without hepatic coma ([place])   . Drug abuse   . AA (alcohol abuse) 11/16/2014  . Hepatitis 09/28/2014  . HCV (hepatitis C virus) 08/12/2014  . Intravenous drug user 07/30/2014    Past Surgical History:  Procedure Laterality Date  . ESOPHAGOGASTRODUODENOSCOPY (EGD) WITH PROPOFOL N/A 10/21/2015   Procedure: ESOPHAGOGASTRODUODENOSCOPY (EGD) WITH PROPOFOL;  Surgeon: Lucilla Lame, MD;  Location: [place];  Service: Endoscopy;  Laterality: N/A;  . HAND SURGERY Left 2008  . I&D EXTREMITY Left 10/08/2015   Arm  . TOOTH EXTRACTION      Prior to Admission medications   Medication Sig Start Date End Date Taking? Authorizing Provider  pantoprazole (PROTONIX) 40 MG tablet Take 1 tablet (40 mg total) by mouth daily. Patient not taking: Reported on 02/16/2017 10/19/16   Lucilla Lame, MD  sucralfate  (CARAFATE) 1 g tablet Take 1 tablet (1 g total) by mouth 4 (four) times daily. Patient not taking: Reported on 02/16/2017 03/29/16 03/29/17  Daymon Larsen, MD  traMADol (ULTRAM) 50 MG tablet Take 1 tablet (50 mg total) by mouth every 6 (six) hours as needed. Patient not taking: Reported on 02/16/2017 10/17/16 10/17/17  Harvest Dark, MD    Allergies  Allergen Reactions  . Tylenol [Acetaminophen]     Pt has hepatitis C     Family History  Problem Relation Age of Onset  . Stroke Mother   . Heart disease Mother   . Arthritis Mother   . Cancer Father        Pancreatic  . Arthritis Daughter        Rheumatoid    Social History Social History  Substance Use Topics  . Smoking status: Current Every Day Smoker    Packs/day: 0.50    Years: 15.00    Types: Cigarettes  . Smokeless tobacco: Never Used  . Alcohol use Yes     Comment: regular (heavy) ETOH consumption-denies alcohol use x1 week on 10/08/15    Review of Systems Unable to obtain a review systems due to unresponsiveness.  ____________________________________________   PHYSICAL EXAM:  Constitutional: Patient is awake, alert, eyes open, positive corneal reflexes. Patient will hold his own head up but is not answering questions or following commands. Eyes: Normal exam, 23 mm, PERRL. ENT   Head: Normocephalic and atraumatic.  Mouth/Throat: Dry mucous membranes Cardiovascular: Normal rate, regular rhythm. No murmurs, rubs, or gallops. Respiratory: Normal respiratory effort without tachypnea nor retractions.  Gastrointestinal: Soft and nontender. No distention.  No reaction to palpation Musculoskeletal: Nontender with normal range of motion in all extremities.  Neurologic:  Patient not speaking or following commands. Is able to hold his head up, and at times does spontaneously move all 4 extremities. Skin:  Skin is warm, dry and intact.  Psychiatric: Mood and affect are  normal.  ____________________________________________    EKG  EKG reviewed and interpreted by myself shows normal sinus rhythm at 95 bpm, narrow QRS, normal axis, normal intervals, no concerning ST changes.   INITIAL IMPRESSION / ASSESSMENT AND PLAN / ED COURSE  Pertinent labs & imaging results that were available during my care of the patient were reviewed by me and considered in my medical decision making (see chart for details).  Patient presents unresponsive dropped off, and the Lucianne Lei that struck him off left before anyone could be questioned. Patient was picked up and brought into the emergency department, found to be unresponsive but is able to keep his eyes open and his head up. Patient does appear altered. Patient was given 2 mg of Narcan, with immediate effect. Patient woke, admits to using Suboxone and smoking methamphetamine. Denies any chest pain or trouble breathing. Denies abdominal pain nausea or vomiting. We will check labs, IV hydrate and continue to closely monitor in the emergency department.  Labs are normal. Patient is requesting discharge home. Patient's wife is here with the patient. We will discharge patient home at this time.  ____________________________________________   FINAL CLINICAL IMPRESSION(S) / ED DIAGNOSES  Substance use Drug overdose, accidental    Harvest Dark, MD 06/15/17 3143

## 2017-06-15 NOTE — ED Triage Notes (Signed)
Pt came to ED via pov. Group of people dropped pt off, assumed to be doing drugs. Pt can answer some questions when stimulated.

## 2017-06-15 NOTE — ED Notes (Signed)
Pt alert and oriented, ambulatory, ready for discharge

## 2017-08-24 ENCOUNTER — Emergency Department: Payer: Self-pay

## 2017-08-24 ENCOUNTER — Emergency Department
Admission: EM | Admit: 2017-08-24 | Discharge: 2017-08-24 | Disposition: A | Discharge status: Home / Self Care | Payer: Self-pay | Attending: Emergency Medicine | Admitting: Emergency Medicine

## 2017-08-24 ENCOUNTER — Encounter: Payer: Self-pay | Admitting: Emergency Medicine

## 2017-08-24 DIAGNOSIS — Y9389 Activity, other specified: Secondary | ICD-10-CM | POA: Insufficient documentation

## 2017-08-24 DIAGNOSIS — M25552 Pain in left hip: Secondary | ICD-10-CM | POA: Insufficient documentation

## 2017-08-24 DIAGNOSIS — W19XXXA Unspecified fall, initial encounter: Secondary | ICD-10-CM

## 2017-08-24 DIAGNOSIS — F17210 Nicotine dependence, cigarettes, uncomplicated: Secondary | ICD-10-CM | POA: Insufficient documentation

## 2017-08-24 DIAGNOSIS — Y929 Unspecified place or not applicable: Secondary | ICD-10-CM | POA: Insufficient documentation

## 2017-08-24 DIAGNOSIS — F1010 Alcohol abuse, uncomplicated: Secondary | ICD-10-CM | POA: Insufficient documentation

## 2017-08-24 DIAGNOSIS — W010XXA Fall on same level from slipping, tripping and stumbling without subsequent striking against object, initial encounter: Secondary | ICD-10-CM | POA: Insufficient documentation

## 2017-08-24 DIAGNOSIS — F1910 Other psychoactive substance abuse, uncomplicated: Secondary | ICD-10-CM | POA: Insufficient documentation

## 2017-08-24 DIAGNOSIS — Y998 Other external cause status: Secondary | ICD-10-CM | POA: Insufficient documentation

## 2017-08-24 DIAGNOSIS — M7918 Myalgia, other site: Secondary | ICD-10-CM | POA: Insufficient documentation

## 2017-08-24 MED ORDER — MORPHINE SULFATE (PF) 4 MG/ML IV SOLN
4.0000 mg | Freq: Once | INTRAVENOUS | Status: AC
  Administered 2017-08-24: 4 mg via INTRAMUSCULAR
  Filled 2017-08-24: qty 1

## 2017-08-24 NOTE — ED Notes (Signed)
Patient supine on stretcher with eyes closed, even and non labored respirations noted. SpO2 85% on RA. Patient placed on 2L . Patient tolerated well. SpO2 up to 98%.

## 2017-08-24 NOTE — ED Triage Notes (Signed)
Patient presents to ED via [facility] from home. Patient was moving a basketball hoop and slipped. Patient denies LOC, unsure if he hit his head of not. GCS 15 on arrival. Patient reports left hip pain. No deformity. Pelvis is stable. No shortening or rotation noted.

## 2017-08-24 NOTE — ED Provider Notes (Signed)
[facility] Emergency Department Provider Note  Time seen: 8:34 AM  I have reviewed the triage vital signs and the nursing notes.   HISTORY  Chief Complaint Hip Pain    HPI Larry Parrish is a 41 y.o. male With a past medical history of hepatitis, substance abuse, presents to the emergency department for left hip and back pain. According to the patient he was attempting to move a basketball goal when he slipped falling on his left hip and buttocks. Does not believe he hit his head. the patient states significant pain in the left hip and left pelvis. describes as an aching pain, worse with movement of the left hip. Patient was able to move himself from outside back inside, states he is able to move the left hip but with significant pain. No other complaints at this time.  Past Medical History:  Diagnosis Date  . GI bleed   . Hepatitis C   . History of pancreatitis     Patient Active Problem List   Diagnosis Date Noted  . Abdominal pain, epigastric   . Reflux esophagitis   . Gastritis   . Abdominal pain, chronic, epigastric 10/13/2015  . Abscess of left arm   . Chronic hepatitis C without hepatic coma ([place])   . Drug abuse ([place])   . AA (alcohol abuse) 11/16/2014  . Hepatitis 09/28/2014  . HCV (hepatitis C virus) 08/12/2014  . Intravenous drug user 07/30/2014    Past Surgical History:  Procedure Laterality Date  . ESOPHAGOGASTRODUODENOSCOPY (EGD) WITH PROPOFOL N/A 10/21/2015   Procedure: ESOPHAGOGASTRODUODENOSCOPY (EGD) WITH PROPOFOL;  Surgeon: Lucilla Lame, MD;  Location: [place];  Service: Endoscopy;  Laterality: N/A;  . HAND SURGERY Left 2008  . I&D EXTREMITY Left 10/08/2015   Arm  . TOOTH EXTRACTION      Prior to Admission medications   Medication Sig Start Date End Date Taking? Authorizing Provider  pantoprazole (PROTONIX) 40 MG tablet Take 1 tablet (40 mg total) by mouth daily. Patient not taking: Reported on 02/16/2017 10/19/16    Lucilla Lame, MD  sucralfate (CARAFATE) 1 g tablet Take 1 tablet (1 g total) by mouth 4 (four) times daily. Patient not taking: Reported on 02/16/2017 03/29/16 03/29/17  Daymon Larsen, MD  traMADol (ULTRAM) 50 MG tablet Take 1 tablet (50 mg total) by mouth every 6 (six) hours as needed. Patient not taking: Reported on 02/16/2017 10/17/16 10/17/17  Harvest Dark, MD    Allergies  Allergen Reactions  . Tylenol [Acetaminophen]     Pt has hepatitis C     Family History  Problem Relation Age of Onset  . Stroke Mother   . Heart disease Mother   . Arthritis Mother   . Cancer Father        Pancreatic  . Arthritis Daughter        Rheumatoid    Social History Social History  Substance Use Topics  . Smoking status: Current Every Day Smoker    Packs/day: 0.50    Years: 15.00    Types: Cigarettes  . Smokeless tobacco: Never Used  . Alcohol use No     Comment: Patient states he is 31 days sober    Review of Systems Constitutional: Negative for loss of consciousness. Cardiovascular: Negative for chest pain. Respiratory: Negative for shortness of breath. Gastrointestinal: Negative for abdominal pain Musculoskeletal: significant left hip and left pelvis pain Skin: Negative for laceration/abrasion Neurological: negative for weakness/numbness. All other ROS negative  ____________________________________________  PHYSICAL EXAM:  VITAL SIGNS: ED Triage Vitals  Enc Vitals Group     BP 08/24/17 0819 (!) 144/87     Pulse Rate 08/24/17 0819 83     Resp 08/24/17 0819 17     Temp 08/24/17 0819 97.7 F (36.5 C)     Temp Source 08/24/17 0819 Oral     SpO2 08/24/17 0819 100 %     Weight 08/24/17 0820 175 lb (79.4 kg)     Height 08/24/17 0820 6\' 1"  (1.854 m)     Head Circumference --      Peak Flow --      Pain Score 08/24/17 0819 10     Pain Loc --      Pain Edu? --      Excl. in [place]? --     Constitutional: Alert and oriented. Well appearing, in mild distress however due to  pain. Eyes: Normal exam ENT   Head: Normocephalic and atraumatic.   Mouth/Throat: Mucous membranes are moist. Cardiovascular: Normal rate, regular rhythm. No murmur Respiratory: Normal respiratory effort without tachypnea nor retractions. Breath sounds are clear  Gastrointestinal: Soft and nontender. No distention.  Musculoskeletal: patient has mild left hip tenderness to palpation, mild pain with range of motion. Moderate left SI joint tenderness to palpation.neurovascular intact distally. Neurologic:  Normal speech and language. No gross focal neurologic deficits Skin:  Skin is warm, dry and intact.  Psychiatric: Mood and affect are normal.  ____________________________________________   RADIOLOGY  hip x-ray negative  ____________________________________________   INITIAL IMPRESSION / ASSESSMENT AND PLAN / ED COURSE  Pertinent labs & imaging results that were available during my care of the patient were reviewed by me and considered in my medical decision making (see chart for details).  patient presents to the emergency department with left hip/left thigh joint pain after a fall. Differential this time would include fracture, dislocation, contusion, muscle strain. We will obtain x-rays. We will dose a shot of pain medication for the patient will awaiting x-ray results. I reviewed the patient's records including an ER visit 2 months ago in which the patient was seen for a likely overdose. I discussed with the patient that we would not be able to discharge him with narcotic pain medications. He is agreeable to this plan.  x-rays negative for fracture. Overall the patient appears extremely well, suspect likely contusion.  ____________________________________________   FINAL CLINICAL IMPRESSION(S) / ED DIAGNOSES  left hip pain musculoskeletal pain    Harvest Dark, MD 08/24/17 [phone]

## 2018-01-09 ENCOUNTER — Telehealth: Payer: Self-pay | Admitting: Gastroenterology

## 2018-01-09 NOTE — Telephone Encounter (Signed)
Please contact this pt and schedule a follow up appt as its been several years since he saw Dr. Allen Norris and we will need updated labs, office notes, etc.   Thank you!

## 2018-01-09 NOTE — Telephone Encounter (Signed)
Pt would like a call from Ginger he states he is ready for treatment  And would like to discuss this with her  Madaline Brilliant to leave message on mother phone # [phone]

## 2018-02-20 ENCOUNTER — Encounter: Payer: Self-pay | Admitting: Gastroenterology

## 2018-02-20 ENCOUNTER — Ambulatory Visit (INDEPENDENT_AMBULATORY_CARE_PROVIDER_SITE_OTHER): Payer: Medicaid Other | Admitting: Gastroenterology

## 2018-02-20 ENCOUNTER — Other Ambulatory Visit
Admission: RE | Admit: 2018-02-20 | Discharge: 2018-02-20 | Disposition: A | Discharge status: Home / Self Care | Payer: Medicaid Other | Source: Ambulatory Visit | Attending: Gastroenterology | Admitting: Gastroenterology

## 2018-02-20 ENCOUNTER — Encounter (INDEPENDENT_AMBULATORY_CARE_PROVIDER_SITE_OTHER): Payer: Self-pay

## 2018-02-20 VITALS — BP 141/93 | HR 91 | Ht 72.0 in | Wt 191.0 lb

## 2018-02-20 DIAGNOSIS — B182 Chronic viral hepatitis C: Secondary | ICD-10-CM | POA: Insufficient documentation

## 2018-02-20 LAB — HEPATIC FUNCTION PANEL
ALBUMIN: 4.3 g/dL (ref 3.5–5.0)
ALT: 18 U/L (ref 17–63)
AST: 24 U/L (ref 15–41)
Alkaline Phosphatase: 64 U/L (ref 38–126)
BILIRUBIN TOTAL: 0.6 mg/dL (ref 0.3–1.2)
Bilirubin, Direct: <0.1 mg/dL — ABNORMAL LOW (ref 0.1–0.5)
TOTAL PROTEIN: 7.7 g/dL (ref 6.5–8.1)

## 2018-02-20 NOTE — Progress Notes (Signed)
    Primary Care Physician: [facility]  Primary Gastroenterologist:  Dr. Lucilla Lame  No chief complaint on file.   HPI: Larry Parrish is a 42 y.o. male here for follow-up of his hepatitis C. The patient had been seen in the past but was actively using cocaine at that time and was told to stop using cocaine prior to him being ent also had an upper epigastric pain and was found to have gastritis. The patientis now here for follow-up.  The patient reports that he has stopped alcohol abuse and is no longer using any IV drugs or cocaine.  The patient states he has been clean for over a year.  The patient is now ready to start his hepatitis C treatment.  The patient has been found to be genotype 3.  His previous elasticity test done in 2016 showed a fibrosis score of F0-F1.  Current Outpatient Medications  Medication Sig Dispense Refill  . pantoprazole (PROTONIX) 40 MG tablet Take 1 tablet (40 mg total) by mouth daily. (Patient not taking: Reported on 02/16/2017) 30 tablet 11  . sucralfate (CARAFATE) 1 g tablet Take 1 tablet (1 g total) by mouth 4 (four) times daily. (Patient not taking: Reported on 02/16/2017) 120 tablet 1   No current facility-administered medications for this visit.     Allergies as of 02/20/2018 - Review Complete 08/24/2017  Allergen Reaction Noted  . Tylenol [acetaminophen]  07/05/2016    ROS:  General: Negative for anorexia, weight loss, fever, chills, fatigue, weakness. ENT: Negative for hoarseness, difficulty swallowing , nasal congestion. CV: Negative for chest pain, angina, palpitations, dyspnea on exertion, peripheral edema.  Respiratory: Negative for dyspnea at rest, dyspnea on exertion, cough, sputum, wheezing.  GI: See history of present illness. GU:  Negative for dysuria, hematuria, urinary incontinence, urinary frequency, nocturnal urination.  Endo: Negative for unusual weight change.    Physical Examination:   There were no  vitals taken for this visit.  General: Well-nourished, well-developed in no acute distress.  Eyes: No icterus. Conjunctivae pink. Mouth: Oropharyngeal mucosa moist and pink , no lesions erythema or exudate. Lungs: Clear to auscultation bilaterally. Non-labored. Heart: Regular rate and rhythm, no murmurs rubs or gallops.  Abdomen: Bowel sounds are normal, nontender, nondistended, no hepatosplenomegaly or masses, no abdominal bruits or hernia , no rebound or guarding.   Extremities: No lower extremity edema. No clubbing or deformities. Neuro: Alert and oriented x 3.  Grossly intact. Skin: Warm and dry, no jaundice.   Psych: Alert and cooperative, normal mood and affect.  Labs:    Imaging Studies: No results found.  Assessment and Plan:   Larry Parrish is a 42 y.o. y/o male who has hepatitis C genotype 3 and has been clean of drugs and alcohol.  The patient is ready to start his treatment for hepatitis C.  The patient also states that his partner will also get tested since she has not been tested for hepatitis C in the past.  Patient will have his labs sent off for anything that is out of date and will be started on treatment once those labs are back.  The patient has been explained the plan and agrees with it.    Lucilla Lame, MD. [name]   Note: This dictation was prepared with Dragon dictation along with smaller phrase technology. Any transcriptional errors that result from this process are unintentional.

## 2018-02-21 LAB — HCV RNA QUANT RFLX ULTRA OR GENOTYP
HCV RNA QNT(LOG COPY/ML): UNDETERMINED {Log_IU}/mL
HepC Qn: NOT DETECTED IU/mL

## 2018-02-25 ENCOUNTER — Telehealth: Payer: Self-pay

## 2018-02-25 NOTE — Telephone Encounter (Signed)
-----   Message from Lucilla Lame, MD sent at 02/21/2018  6:53 PM EDT ----- The patient know that his hepatitis C viral load is negative which means he may have resolved the infection.  He should have his viral load checked again in 6 months and if negative then he will be deemed not to have the virus.

## 2018-02-25 NOTE — Telephone Encounter (Signed)
Tried contacting pt but vm box was full.

## 2018-02-26 NOTE — Telephone Encounter (Signed)
-----   Message from Lucilla Lame, MD sent at 02/21/2018  6:53 PM EDT ----- The patient know that his hepatitis C viral load is negative which means he may have resolved the infection.  He should have his viral load checked again in 6 months and if negative then he will be deemed not to have the virus.

## 2018-02-26 NOTE — Telephone Encounter (Signed)
Tried contacting pt again today, but voicemail box was full. Unable to leave message.

## 2018-02-27 NOTE — Telephone Encounter (Signed)
Pt has been notified of results. Pt will have viral load rechecked in 6 months.

## 2018-04-18 ENCOUNTER — Emergency Department
Admission: EM | Admit: 2018-04-18 | Discharge: 2018-04-18 | Disposition: A | Discharge status: Home / Self Care | Payer: Medicaid Other | Attending: Emergency Medicine | Admitting: Emergency Medicine

## 2018-04-18 ENCOUNTER — Encounter: Payer: Self-pay | Admitting: *Deleted

## 2018-04-18 DIAGNOSIS — H5711 Ocular pain, right eye: Secondary | ICD-10-CM | POA: Insufficient documentation

## 2018-04-18 DIAGNOSIS — Z5321 Procedure and treatment not carried out due to patient leaving prior to being seen by health care provider: Secondary | ICD-10-CM | POA: Insufficient documentation

## 2018-04-18 NOTE — ED Triage Notes (Signed)
Pt reports pain in right eye since yesterday. Pt believes he had a flake of paint fall in his eye at work. Pain has worsened. No changes in vision. Eye is red upon arrival but no visible trauma noted.

## 2018-05-11 ENCOUNTER — Encounter: Payer: Self-pay | Admitting: Emergency Medicine

## 2018-05-11 ENCOUNTER — Emergency Department
Admission: EM | Admit: 2018-05-11 | Discharge: 2018-05-11 | Disposition: A | Discharge status: Home / Self Care | Payer: Medicaid Other | Attending: Emergency Medicine | Admitting: Emergency Medicine

## 2018-05-11 DIAGNOSIS — W208XXA Other cause of strike by thrown, projected or falling object, initial encounter: Secondary | ICD-10-CM | POA: Insufficient documentation

## 2018-05-11 DIAGNOSIS — S41112A Laceration without foreign body of left upper arm, initial encounter: Secondary | ICD-10-CM | POA: Insufficient documentation

## 2018-05-11 DIAGNOSIS — R079 Chest pain, unspecified: Secondary | ICD-10-CM | POA: Insufficient documentation

## 2018-05-11 DIAGNOSIS — Y939 Activity, unspecified: Secondary | ICD-10-CM | POA: Insufficient documentation

## 2018-05-11 DIAGNOSIS — Y999 Unspecified external cause status: Secondary | ICD-10-CM | POA: Insufficient documentation

## 2018-05-11 DIAGNOSIS — K219 Gastro-esophageal reflux disease without esophagitis: Secondary | ICD-10-CM | POA: Insufficient documentation

## 2018-05-11 DIAGNOSIS — Y92008 Other place in unspecified non-institutional (private) residence as the place of occurrence of the external cause: Secondary | ICD-10-CM | POA: Insufficient documentation

## 2018-05-11 DIAGNOSIS — F17210 Nicotine dependence, cigarettes, uncomplicated: Secondary | ICD-10-CM | POA: Insufficient documentation

## 2018-05-11 MED ORDER — GI COCKTAIL ~~LOC~~
30.0000 mL | Freq: Once | ORAL | Status: AC
  Administered 2018-05-11: 30 mL via ORAL
  Filled 2018-05-11: qty 30

## 2018-05-11 MED ORDER — LIDOCAINE HCL (PF) 1 % IJ SOLN
10.0000 mL | Freq: Once | INTRAMUSCULAR | Status: AC
  Administered 2018-05-11: 10 mL

## 2018-05-11 MED ORDER — PANTOPRAZOLE SODIUM 40 MG PO TBEC
40.0000 mg | DELAYED_RELEASE_TABLET | Freq: Once | ORAL | Status: AC
  Administered 2018-05-11: 40 mg via ORAL
  Filled 2018-05-11: qty 1

## 2018-05-11 MED ORDER — LIDOCAINE HCL (PF) 1 % IJ SOLN
10.0000 mL | Freq: Once | INTRAMUSCULAR | Status: DC
  Filled 2018-05-11: qty 10

## 2018-05-11 MED ORDER — LIDOCAINE HCL (PF) 1 % IJ SOLN
INTRAMUSCULAR | Status: AC
  Filled 2018-05-11: qty 10

## 2018-05-11 NOTE — ED Provider Notes (Signed)
[facility] Emergency Department Provider Note  ____________________________________________  Time seen: Approximately 9:57 PM  I have reviewed the triage vital signs and the nursing notes.   HISTORY  Chief Complaint Laceration    HPI Larry Parrish is a 42 y.o. male who presents the emergency department complaining of left arm laceration.  Patient presented to the emergency department complaining of a laceration to the left posterior lateral aspect of the upper arm.  He was working in his garage when some items off of the shelf fell.  Patient reports that he had both sheet-metal as well as went on the shelf.  He is unsure what caused the laceration.  Patient is up-to-date on his tetanus shot.  Patient denies any other injury or complaint.  No medications for this complaint prior to arrival.  Halfway through laceration repair, patient began complaining of left-sided chest pain.  Patient became diaphoretic, tachypneic.  Patient denies any cardiac history.  Patient does report a history of reflux as well as a history of gastric ulcer, pancreatitis, hepatitis.  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  He reports that he has gone through detox and no longer drinks or uses cocaine.  Patient does admit to amphetamine use, last time was approximately 24 hours ago.    Past Medical History:  Diagnosis Date  . GI bleed   . Hepatitis C   . History of pancreatitis     Patient Active Problem List   Diagnosis Date Noted  . Abdominal pain, epigastric   . Reflux esophagitis   . Gastritis   . Abdominal pain, chronic, epigastric 10/13/2015  . Abscess of left arm   . Chronic hepatitis C without hepatic coma ([place])   . Drug abuse ([place])   . AA (alcohol abuse) 11/16/2014  . Hepatitis 09/28/2014  . HCV (hepatitis C virus) 08/12/2014  . Intravenous drug user 07/30/2014    Past Surgical History:  Procedure Laterality Date  .  ESOPHAGOGASTRODUODENOSCOPY (EGD) WITH PROPOFOL N/A 10/21/2015   Procedure: ESOPHAGOGASTRODUODENOSCOPY (EGD) WITH PROPOFOL;  Surgeon: Lucilla Lame, MD;  Location: [facility];  Service: Endoscopy;  Laterality: N/A;  . HAND SURGERY Left 2008  . I&D EXTREMITY Left 10/08/2015   Arm  . TOOTH EXTRACTION      Prior to Admission medications   Medication Sig Start Date End Date Taking? Authorizing Provider  sucralfate (CARAFATE) 1 g tablet Take 1 tablet (1 g total) by mouth 4 (four) times daily. Patient not taking: Reported on 02/16/2017 03/29/16 03/29/17  Daymon Larsen, MD    Allergies Tylenol [acetaminophen]  Family History  Problem Relation Age of Onset  . Stroke Mother   . Heart disease Mother   . Arthritis Mother   . Cancer Father        Pancreatic  . Arthritis Daughter        Rheumatoid    Social History Social History   Tobacco Use  . Smoking status: Current Every Day Smoker    Packs/day: 0.50    Years: 15.00    Pack years: 7.50    Types: Cigarettes  . Smokeless tobacco: Never Used  Substance Use Topics  . Alcohol use: Not Currently  . Drug use: Yes    Types: Methamphetamines, Marijuana, Cocaine     Review of Systems  Constitutional: No fever/chills Eyes: No visual changes. No discharge ENT: No upper respiratory complaints. Cardiovascular: Positive for left-sided chest pain. Respiratory: no  cough. No SOB. Gastrointestinal: No abdominal pain.  No nausea, no vomiting.  No diarrhea.  No constipation.  Some reflux symptoms. Musculoskeletal: Negative for musculoskeletal pain. Skin: Laceration to the left upper arm Neurological: Negative for headaches, focal weakness or numbness. 10-point ROS otherwise negative.  ____________________________________________   PHYSICAL EXAM:  VITAL SIGNS: ED Triage Vitals [05/11/18 2125]  Enc Vitals Group     BP 134/75     Pulse Rate 96     Resp 18     Temp 98.6 F (37 C)     Temp Source Oral     SpO2 99 %      Weight 200 lb (90.7 kg)     Height 6\' 1"  (1.854 m)     Head Circumference      Peak Flow      Pain Score 10     Pain Loc      Pain Edu?      Excl. in [place]?      Constitutional: Alert and oriented. Well appearing and in no acute distress. Eyes: Conjunctivae are normal. PERRL. EOMI. Head: Atraumatic. Neck: No stridor.    Cardiovascular: Normal rate, regular rhythm. Normal S1 and S2.  No muffled heart sounds.  No murmurs, rubs, gallops.  No apical heave.  Good peripheral circulation. Respiratory: Normal respiratory effort without tachypnea or retractions. Lungs CTAB. Good air entry to the bases with no decreased or absent breath sounds. Gastrointestinal: Bowel sounds 4 quadrants. Soft and nontender to palpation. No guarding or rigidity. No palpable masses. No distention.  Musculoskeletal: Full range of motion to all extremities. No gross deformities appreciated. Neurologic:  Normal speech and language. No gross focal neurologic deficits are appreciated.  Skin:  Skin is warm, dry and intact. No rash noted.  4 cm laceration to the left lateral arm.  Edges are smooth in nature.  No visible foreign body.  No bleeding at this time. Psychiatric: Mood and affect are normal. Speech and behavior are normal. Patient exhibits appropriate insight and judgement.   ____________________________________________   LABS (all labs ordered are listed, but only abnormal results are displayed)  Labs Reviewed - No data to display ____________________________________________  EKG  ED ECG REPORT I, Charline Bills Obie Kallenbach,  personally viewed and interpreted this ECG.   Date: 05/11/2018  EKG Time: 2251 hrs.  Rate: 72 bpm  Rhythm: normal EKG, normal sinus rhythm, unchanged from previous tracings  Axis: Normal axis  Intervals:none  ST&T Change: No ST elevations or depressions noted.  Normal EKG.  ____________________________________________  RADIOLOGY   No results  found.  ____________________________________________    PROCEDURES  Procedure(s) performed:    Marland KitchenMarland KitchenLaceration Repair Date/Time: 05/11/2018 11:02 PM Performed by: Darletta Moll, PA-C Authorized by: Darletta Moll, PA-C   Consent:    Consent obtained:  Verbal   Consent given by:  Patient   Risks discussed:  Pain Anesthesia (see MAR for exact dosages):    Anesthesia method:  Local infiltration   Local anesthetic:  Lidocaine 1% w/o epi Laceration details:    Location:  Shoulder/arm   Shoulder/arm location:  L upper arm   Length (cm):  4 Repair type:    Repair type:  Simple Exploration:    Hemostasis achieved with:  Direct pressure   Wound exploration: wound explored through full range of motion and entire depth of wound probed and visualized     Wound extent: no foreign bodies/material noted, no muscle damage noted, no nerve damage noted, no tendon damage noted,  no underlying fracture noted and no vascular damage noted     Contaminated: no   Treatment:    Area cleansed with:  Betadine   Amount of cleaning:  Standard   Irrigation solution:  Sterile saline   Irrigation volume:  547ml   Irrigation method:  Syringe Skin repair:    Repair method:  Sutures   Suture size:  4-0   Suture material:  Nylon   Suture technique:  Simple interrupted   Number of sutures:  7 Approximation:    Approximation:  Close Post-procedure details:    Dressing:  Open (no dressing)   Patient tolerance of procedure:  Tolerated well, no immediate complications      Medications  pantoprazole (PROTONIX) EC tablet 40 mg (has no administration in time range)  gi cocktail (Maalox,Lidocaine,Donnatal) (has no administration in time range)  lidocaine (PF) (XYLOCAINE) 1 % injection (has no administration in time range)  lidocaine (PF) (XYLOCAINE) 1 % injection 10 mL (10 mLs Infiltration Given 05/11/18 2258)     ____________________________________________   INITIAL IMPRESSION /  ASSESSMENT AND PLAN / ED COURSE  Pertinent labs & imaging results that were available during my care of the patient were reviewed by me and considered in my medical decision making (see chart for details).  Review of the [place] CSRS was performed in accordance of the [place] prior to dispensing any controlled drugs.  Clinical Course as of May 11 2302  Sat May 11, 2018  2253 Patient presented to the emergency department complaining of laceration to the left upper arm.  Laceration is repaired as described above.  No complications for repair.  During laceration repair, patient began to endorse left-sided chest pain.  Patient was tachypneic, pale, mildly diaphoretic.  Patient did have some associated belching.  Patient has a history of hepatitis C, pancreatitis, gastric ulcer.  No cardiac history.  Given patient's symptoms, differential included cardiac etiology, anxiety, GERD, pancreatitis or gastritis.  EKG was unremarkable with normal sinus rhythm.  Given patient's belching, GI history, I suspect that symptoms are most likely contributed to reflux versus cardiac etiology.  At this time, patient declines any further cardiac work-up which I feel as reasonable.  Patient is given follow-up instructions to return to the emergency department for any increase of chest pain.  Patient will be given GI cocktail and pantoprazole here in the emergency department for symptomatic relief.   [JC]    Clinical Course User Index [JC] Arlander Gillen, Charline Bills, PA-C     Patient's diagnosis is consistent with left arm laceration, nonspecific chest pain.  Patient presents the emergency department complaining of laceration to the left upper arm.  During suturing, patient began to experience left-sided chest pain.  Patient with no cardiac history.  Exam was overall reassuring.  Patient does have a history of multiple GI complaints and was experiencing reflux with belching during laceration repair.  At this time, with a normal EKG,  otherwise reassuring history with obvious GI symptoms, symptoms are most likely GI related.  Patient will be given GI cocktail as well as pantoprazole.  No prescriptions on discharge.  Patient will follow-up with primary care in 1 week for suture removal.  Patient is given ED precautions to return to the ED for any worsening or new symptoms.     ____________________________________________  FINAL CLINICAL IMPRESSION(S) / ED DIAGNOSES  Final diagnoses:  Laceration of left upper extremity, initial encounter  Nonspecific chest pain  Gastroesophageal reflux disease, esophagitis presence not specified  NEW MEDICATIONS STARTED DURING THIS VISIT:  ED Discharge Orders    None          This chart was dictated using voice recognition software/Dragon. Despite best efforts to proofread, errors can occur which can change the meaning. Any change was purely unintentional.    Darletta Moll, PA-C 05/11/18 2303    Schuyler Amor, MD 05/11/18 [phone]

## 2018-05-11 NOTE — ED Triage Notes (Signed)
Patient states that he was working in a shop and some metal started to fall so he was trying to catch it and was cut on the let upper arm. Patient with laceration present to left upper arm with bleeding controlled.

## 2018-05-11 NOTE — ED Notes (Signed)
Pt reports that some things in his shop fell and he caught them to keep it from hitting his son. Pt injured his left arm. Pt alert and oriented x 4

## 2018-10-09 ENCOUNTER — Other Ambulatory Visit: Payer: Self-pay

## 2018-10-09 ENCOUNTER — Telehealth: Payer: Self-pay

## 2018-10-09 DIAGNOSIS — B182 Chronic viral hepatitis C: Secondary | ICD-10-CM

## 2018-10-09 NOTE — Telephone Encounter (Signed)
Pt would like a call from [facility] his return number is cb [phone]

## 2018-10-09 NOTE — Telephone Encounter (Signed)
Pt was advised of his last lab result. Pt has requested to recheck his viral load as [place] 2 [place]. Orders placed. Pt will have done on Monday, Dec 2nd.

## 2018-10-09 NOTE — Telephone Encounter (Signed)
Pt missed your call and would like to know which date he became Hep free

## 2019-03-31 ENCOUNTER — Ambulatory Visit: Payer: Self-pay | Admitting: *Deleted

## 2019-03-31 NOTE — Telephone Encounter (Signed)
Pt reports swelling of left foot, ankle up to knee, onset 1 hour ago. States was working in yard earlier today and did see "Some snakes." States leg is red, painful, "With red streaks." Denies SOB. Pt states "I see what looks like 2 bite marks at my ankle with white in the center." Pt directed to ED now. States family will drive.  Reason for Disposition . [1] Can't walk or can barely walk AND [2] new onset  Answer Assessment - Initial Assessment Questions 1. ONSET: "When did the swelling start?" (e.g., minutes, hours, days)    1 hour ago 2. LOCATION: "What part of the leg is swollen?"  "Are both legs swollen or just one leg?"     Left foot, ankle up to kneet  3. SEVERITY: "How bad is the swelling?" (e.g., localized; mild, moderate, severe)  - Localized - small area of swelling localized to one leg  - MILD pedal edema - swelling limited to foot and ankle, pitting edema < 1/4 inch (6 mm) deep, rest and elevation eliminate most or all swelling  - MODERATE edema - swelling of lower leg to knee, pitting edema > 1/4 inch (6 mm) deep, rest and elevation only partially reduce swelling  - SEVERE edema - swelling extends above knee, facial or hand swelling preset      Moderate 4. REDNESS: "Does the swelling look red or infected?"     Red,  5. PAIN: "Is the swelling painful to touch?" If so, ask: "How painful is it?"   (Scale 0; mild, moderate or severe)     Moderate 6. FEVER: "Do you have a fever?" If so, ask: "What is it, how was it measured, and when did it start?"      no 7. CAUSE: "What do you think is causing the leg swelling?"     Possible snake bite 8. MEDICAL HISTORY: "Do you have a history of heart failure, kidney disease, liver failure, or cancer?"     no 9. RECURRENT SYMPTOM: "Have you had leg swelling before?" If so, ask: "When was the last time?" "What happened that time?"     no 10. OTHER SYMPTOMS: "Do you have any other symptoms?" (e.g., chest pain, difficulty breathing)        none  Protocols used: LEG SWELLING AND EDEMA-A-AH

## 2019-04-01 ENCOUNTER — Emergency Department: Payer: Self-pay

## 2019-04-01 ENCOUNTER — Other Ambulatory Visit: Payer: Self-pay

## 2019-04-01 ENCOUNTER — Inpatient Hospital Stay
Admission: EM | Admit: 2019-04-01 | Discharge: 2019-04-03 | DRG: 603 | Disposition: A | Discharge status: Home / Self Care | Payer: Self-pay | Attending: Internal Medicine | Admitting: Internal Medicine

## 2019-04-01 DIAGNOSIS — L03116 Cellulitis of left lower limb: Principal | ICD-10-CM | POA: Diagnosis present

## 2019-04-01 DIAGNOSIS — B1920 Unspecified viral hepatitis C without hepatic coma: Secondary | ICD-10-CM | POA: Diagnosis present

## 2019-04-01 DIAGNOSIS — M7989 Other specified soft tissue disorders: Secondary | ICD-10-CM

## 2019-04-01 DIAGNOSIS — W5911XA Bitten by nonvenomous snake, initial encounter: Secondary | ICD-10-CM

## 2019-04-01 DIAGNOSIS — L03119 Cellulitis of unspecified part of limb: Secondary | ICD-10-CM | POA: Diagnosis present

## 2019-04-01 DIAGNOSIS — Z888 Allergy status to other drugs, medicaments and biological substances status: Secondary | ICD-10-CM

## 2019-04-01 DIAGNOSIS — Z79899 Other long term (current) drug therapy: Secondary | ICD-10-CM

## 2019-04-01 DIAGNOSIS — M6282 Rhabdomyolysis: Secondary | ICD-10-CM | POA: Diagnosis present

## 2019-04-01 DIAGNOSIS — F1010 Alcohol abuse, uncomplicated: Secondary | ICD-10-CM | POA: Diagnosis present

## 2019-04-01 DIAGNOSIS — Z20828 Contact with and (suspected) exposure to other viral communicable diseases: Secondary | ICD-10-CM | POA: Diagnosis present

## 2019-04-01 DIAGNOSIS — F17210 Nicotine dependence, cigarettes, uncomplicated: Secondary | ICD-10-CM | POA: Diagnosis present

## 2019-04-01 DIAGNOSIS — Z8711 Personal history of peptic ulcer disease: Secondary | ICD-10-CM

## 2019-04-01 LAB — URINALYSIS, COMPLETE (UACMP) WITH MICROSCOPIC
Bacteria, UA: NONE SEEN
Bilirubin Urine: NEGATIVE
Glucose, UA: NEGATIVE mg/dL
Hgb urine dipstick: NEGATIVE
Ketones, ur: 5 mg/dL — AB
Leukocytes,Ua: NEGATIVE
Nitrite: NEGATIVE
Protein, ur: NEGATIVE mg/dL
Specific Gravity, Urine: 1.024 (ref 1.005–1.030)
pH: 6 (ref 5.0–8.0)

## 2019-04-01 LAB — PROTIME-INR
INR: 1 (ref 0.8–1.2)
INR: 1 (ref 0.8–1.2)
Prothrombin Time: 13.1 seconds (ref 11.4–15.2)
Prothrombin Time: 13.4 seconds (ref 11.4–15.2)

## 2019-04-01 LAB — CBC WITH DIFFERENTIAL/PLATELET
Abs Immature Granulocytes: 0.03 10*3/uL (ref 0.00–0.07)
Basophils Absolute: 0 10*3/uL (ref 0.0–0.1)
Basophils Relative: 0 %
Eosinophils Absolute: 0.1 10*3/uL (ref 0.0–0.5)
Eosinophils Relative: 1 %
HCT: 34.5 % — ABNORMAL LOW (ref 39.0–52.0)
Hemoglobin: 11.8 g/dL — ABNORMAL LOW (ref 13.0–17.0)
Immature Granulocytes: 0 %
Lymphocytes Relative: 13 %
Lymphs Abs: 1.3 10*3/uL (ref 0.7–4.0)
MCH: 31 pg (ref 26.0–34.0)
MCHC: 34.2 g/dL (ref 30.0–36.0)
MCV: 90.6 fL (ref 80.0–100.0)
Monocytes Absolute: 0.9 10*3/uL (ref 0.1–1.0)
Monocytes Relative: 10 %
Neutro Abs: 7.3 10*3/uL (ref 1.7–7.7)
Neutrophils Relative %: 76 %
Platelets: 224 10*3/uL (ref 150–400)
RBC: 3.81 MIL/uL — ABNORMAL LOW (ref 4.22–5.81)
RDW: 11.8 % (ref 11.5–15.5)
WBC: 9.5 10*3/uL (ref 4.0–10.5)
nRBC: 0 % (ref 0.0–0.2)

## 2019-04-01 LAB — CBC
HCT: 32.9 % — ABNORMAL LOW (ref 39.0–52.0)
Hemoglobin: 11 g/dL — ABNORMAL LOW (ref 13.0–17.0)
MCH: 30.5 pg (ref 26.0–34.0)
MCHC: 33.4 g/dL (ref 30.0–36.0)
MCV: 91.1 fL (ref 80.0–100.0)
Platelets: 210 10*3/uL (ref 150–400)
RBC: 3.61 MIL/uL — ABNORMAL LOW (ref 4.22–5.81)
RDW: 11.9 % (ref 11.5–15.5)
WBC: 5.4 10*3/uL (ref 4.0–10.5)
nRBC: 0 % (ref 0.0–0.2)

## 2019-04-01 LAB — COMPREHENSIVE METABOLIC PANEL
ALT: 31 U/L (ref 0–44)
AST: 51 U/L — ABNORMAL HIGH (ref 15–41)
Albumin: 4.1 g/dL (ref 3.5–5.0)
Alkaline Phosphatase: 53 U/L (ref 38–126)
Anion gap: 7 (ref 5–15)
BUN: 17 mg/dL (ref 6–20)
CO2: 26 mmol/L (ref 22–32)
Calcium: 8.8 mg/dL — ABNORMAL LOW (ref 8.9–10.3)
Chloride: 102 mmol/L (ref 98–111)
Creatinine, Ser: 0.68 mg/dL (ref 0.61–1.24)
GFR calc Af Amer: >60 mL/min (ref >60)
GFR calc non Af Amer: >60 mL/min (ref >60)
Glucose, Bld: 134 mg/dL — ABNORMAL HIGH (ref 70–99)
Potassium: 3.6 mmol/L (ref 3.5–5.1)
Sodium: 135 mmol/L (ref 135–145)
Total Bilirubin: 1.9 mg/dL — ABNORMAL HIGH (ref 0.3–1.2)
Total Protein: 7.3 g/dL (ref 6.5–8.1)

## 2019-04-01 LAB — FIBRINOGEN: Fibrinogen: 554 mg/dL — ABNORMAL HIGH (ref 210–475)

## 2019-04-01 LAB — FIBRIN DERIVATIVES D-DIMER (ARMC ONLY): Fibrin derivatives D-dimer (ARMC): 669.53 ng/mL (FEU) — ABNORMAL HIGH (ref 0.00–499.00)

## 2019-04-01 LAB — CK: Total CK: 890 U/L — ABNORMAL HIGH (ref 49–397)

## 2019-04-01 LAB — LIPASE, BLOOD: Lipase: 23 U/L (ref 11–51)

## 2019-04-01 LAB — APTT
aPTT: 32 seconds (ref 24–36)
aPTT: 35 seconds (ref 24–36)

## 2019-04-01 LAB — SARS CORONAVIRUS 2 BY RT PCR (HOSPITAL ORDER, PERFORMED IN ~~LOC~~ HOSPITAL LAB): SARS Coronavirus 2: NEGATIVE

## 2019-04-01 MED ORDER — FENTANYL CITRATE (PF) 100 MCG/2ML IJ SOLN
100.0000 ug | Freq: Once | INTRAMUSCULAR | Status: AC
  Administered 2019-04-01: 100 ug via INTRAVENOUS
  Filled 2019-04-01: qty 2

## 2019-04-01 MED ORDER — NAPROXEN 500 MG PO TABS: 500.0000 mg | ORAL_TABLET | Freq: Two times a day (BID) | ORAL | 0 refills | Status: DC

## 2019-04-01 MED ORDER — SULFAMETHOXAZOLE-TRIMETHOPRIM 800-160 MG PO TABS: 1.0000 | ORAL_TABLET | Freq: Two times a day (BID) | ORAL | 0 refills | Status: DC

## 2019-04-01 MED ORDER — SODIUM CHLORIDE 0.9 % IV BOLUS
1000.0000 mL | Freq: Once | INTRAVENOUS | Status: AC
  Administered 2019-04-01: 1000 mL via INTRAVENOUS

## 2019-04-01 MED ORDER — VANCOMYCIN HCL 10 G IV SOLR
2500.0000 mg | Freq: Once | INTRAVENOUS | Status: AC
  Administered 2019-04-01: 2500 mg via INTRAVENOUS
  Filled 2019-04-01 (×2): qty 2500

## 2019-04-01 MED ORDER — ACETAMINOPHEN 650 MG RE SUPP: 650.0000 mg | Freq: Four times a day (QID) | RECTAL | Status: DC | PRN

## 2019-04-01 MED ORDER — ONDANSETRON HCL 4 MG/2ML IJ SOLN: 4.0000 mg | Freq: Four times a day (QID) | INTRAMUSCULAR | Status: DC | PRN

## 2019-04-01 MED ORDER — SODIUM CHLORIDE 0.9% FLUSH
3.0000 mL | Freq: Two times a day (BID) | INTRAVENOUS | Status: DC
  Administered 2019-04-01 – 2019-04-03 (×3): 3 mL via INTRAVENOUS

## 2019-04-01 MED ORDER — LACTATED RINGERS IV SOLN
INTRAVENOUS | Status: DC
  Administered 2019-04-01 – 2019-04-02 (×2): via INTRAVENOUS

## 2019-04-01 MED ORDER — ACETAMINOPHEN 325 MG PO TABS
650.0000 mg | ORAL_TABLET | Freq: Four times a day (QID) | ORAL | Status: DC | PRN
  Administered 2019-04-02: 08:00:00 650 mg via ORAL
  Filled 2019-04-01: qty 2

## 2019-04-01 MED ORDER — VANCOMYCIN HCL 10 G IV SOLR
1750.0000 mg | Freq: Two times a day (BID) | INTRAVENOUS | Status: DC
  Administered 2019-04-02 – 2019-04-03 (×3): 1750 mg via INTRAVENOUS
  Filled 2019-04-01 (×4): qty 1750

## 2019-04-01 MED ORDER — PIPERACILLIN-TAZOBACTAM 3.375 G IVPB
3.3750 g | Freq: Three times a day (TID) | INTRAVENOUS | Status: DC
  Administered 2019-04-01 – 2019-04-02 (×2): 3.375 g via INTRAVENOUS
  Filled 2019-04-01 (×2): qty 50

## 2019-04-01 MED ORDER — ONDANSETRON HCL 4 MG PO TABS: 4.0000 mg | ORAL_TABLET | Freq: Four times a day (QID) | ORAL | Status: DC | PRN

## 2019-04-01 MED ORDER — VANCOMYCIN HCL 10 G IV SOLR: 2000.0000 mg | Freq: Two times a day (BID) | INTRAVENOUS | Status: DC

## 2019-04-01 MED ORDER — CEPHALEXIN 500 MG PO CAPS: 500.0000 mg | ORAL_CAPSULE | Freq: Three times a day (TID) | ORAL | 0 refills | Status: DC

## 2019-04-01 MED ORDER — POLYETHYLENE GLYCOL 3350 17 G PO PACK: 17.0000 g | PACK | Freq: Every day | ORAL | Status: DC | PRN

## 2019-04-01 MED ORDER — TRAMADOL HCL 50 MG PO TABS
50.0000 mg | ORAL_TABLET | Freq: Four times a day (QID) | ORAL | Status: DC | PRN
  Administered 2019-04-01: 20:00:00 50 mg via ORAL
  Filled 2019-04-01 (×2): qty 1

## 2019-04-01 MED ORDER — VANCOMYCIN HCL IN DEXTROSE 1-5 GM/200ML-% IV SOLN
1000.0000 mg | Freq: Once | INTRAVENOUS | Status: DC
Start: 2019-04-01 — End: 2019-04-01

## 2019-04-01 NOTE — ED Notes (Addendum)
Left  Ankle - 9.5" Calf - 15.5" Thigh - 16.25"  Right  Ankle - 8.75" Calf - 14.5" Thigh - 16.5"  Patient reports that his pain is better and he feels normal. Patient provided lunch tray.

## 2019-04-01 NOTE — ED Notes (Signed)
Left  Foot - 10.75" Ankle - 9.50" Calf - 15.75" Thigh - 16.75"  Right  Foot - 10.00" Ankle - 9.00" Calf - 15.25" Thigh - 17.00"

## 2019-04-01 NOTE — Discharge Instructions (Signed)
Cellulitis, Adult ° °Cellulitis is a skin infection. The infected area is often warm, red, swollen, and sore. It occurs most often in the arms and lower legs. It is very important to get treated for this condition. °What are the causes? °This condition is caused by bacteria. The bacteria enter through a break in the skin, such as a cut, burn, insect bite, open sore, or crack. °What increases the risk? °This condition is more likely to occur in people who: °· Have a weak body defense system (immune system). °· Have open cuts, burns, bites, or scrapes on the skin. °· Are older than 43 years of age. °· Have a blood sugar problem (diabetes). °· Have a long-lasting (chronic) liver disease (cirrhosis) or kidney disease. °· Are very overweight (obese). °· Have a skin problem, such as: °? Itchy rash (eczema). °? Slow movement of blood in the veins (venous stasis). °? Fluid buildup below the skin (edema). °· Have been treated with high-energy rays (radiation). °· Use IV drugs. °What are the signs or symptoms? °Symptoms of this condition include: °· Skin that is: °? Red. °? Streaking. °? Spotting. °? Swollen. °? Sore or painful when you touch it. °? Warm. °· A fever. °· Chills. °· Blisters. °How is this diagnosed? °This condition is diagnosed based on: °· Medical history. °· Physical exam. °· Blood tests. °· Imaging tests. °How is this treated? °Treatment for this condition may include: °· Medicines to treat infections or allergies. °· Home care, such as: °? Rest. °? Placing cold or warm cloths (compresses) on the skin. °· Hospital care, if the condition is very bad. °Follow these instructions at home: °Medicines °· Take over-the-counter and prescription medicines only as told by your doctor. °· If you were prescribed an antibiotic medicine, take it as told by your doctor. Do not stop taking it even if you start to feel better. °General instructions ° °· Drink enough fluid to keep your pee (urine) pale yellow. °· Do not touch  or rub the infected area. °· Raise (elevate) the infected area above the level of your heart while you are sitting or lying down. °· Place cold or warm cloths on the area as told by your doctor. °· Keep all follow-up visits as told by your doctor. This is important. °Contact a doctor if: °· You have a fever. °· You do not start to get better after 1-2 days of treatment. °· Your bone or joint under the infected area starts to hurt after the skin has healed. °· Your infection comes back. This can happen in the same area or another area. °· You have a swollen bump in the area. °· You have new symptoms. °· You feel ill and have muscle aches and pains. °Get help right away if: °· Your symptoms get worse. °· You feel very sleepy. °· You throw up (vomit) or have watery poop (diarrhea) for a long time. °· You see red streaks coming from the area. °· Your red area gets larger. °· Your red area turns dark in color. °These symptoms may represent a serious problem that is an emergency. Do not wait to see if the symptoms will go away. Get medical help right away. Call your local emergency services (911 in the [place] Do not drive yourself to the hospital. °Summary °· Cellulitis is a skin infection. The area is often warm, red, swollen, and sore. °· This condition is treated with medicines, rest, and cold and warm cloths. °· Take all medicines only   as told by your doctor.  Tell your doctor if symptoms do not start to get better after 1-2 days of treatment. This information is not intended to replace advice given to you by your health care provider. Make sure you discuss any questions you have with your health care provider. Document Released: 04/17/2008 Document Revised: 03/21/2018 Document Reviewed: 03/21/2018 Elsevier Interactive Patient Education  2019 [employer]   Cellulitis, Adult  Cellulitis is a skin infection. The infected area is often warm, red, swollen, and sore. It occurs most often in the arms and lower  legs. It is very important to get treated for this condition. What are the causes? This condition is caused by bacteria. The bacteria enter through a break in the skin, such as a cut, burn, insect bite, open sore, or crack. What increases the risk? This condition is more likely to occur in people who:  Have a weak body defense system (immune system).  Have open cuts, burns, bites, or scrapes on the skin.  Are older than 43 years of age.  Have a blood sugar problem (diabetes).  Have a long-lasting (chronic) liver disease (cirrhosis) or kidney disease.  Are very overweight (obese).  Have a skin problem, such as: ? Itchy rash (eczema). ? Slow movement of blood in the veins (venous stasis). ? Fluid buildup below the skin (edema).  Have been treated with high-energy rays (radiation).  Use IV drugs. What are the signs or symptoms? Symptoms of this condition include:  Skin that is: ? Red. ? Streaking. ? Spotting. ? Swollen. ? Sore or painful when you touch it. ? Warm.  A fever.  Chills.  Blisters. How is this diagnosed? This condition is diagnosed based on:  Medical history.  Physical exam.  Blood tests.  Imaging tests. How is this treated? Treatment for this condition may include:  Medicines to treat infections or allergies.  Home care, such as: ? Rest. ? Placing cold or warm cloths (compresses) on the skin.  Hospital care, if the condition is very bad. Follow these instructions at home: Medicines  Take over-the-counter and prescription medicines only as told by your doctor.  If you were prescribed an antibiotic medicine, take it as told by your doctor. Do not stop taking it even if you start to feel better. General instructions   Drink enough fluid to keep your pee (urine) pale yellow.  Do not touch or rub the infected area.  Raise (elevate) the infected area above the level of your heart while you are sitting or lying down.  Place cold or warm  cloths on the area as told by your doctor.  Keep all follow-up visits as told by your doctor. This is important. Contact a doctor if:  You have a fever.  You do not start to get better after 1-2 days of treatment.  Your bone or joint under the infected area starts to hurt after the skin has healed.  Your infection comes back. This can happen in the same area or another area.  You have a swollen bump in the area.  You have new symptoms.  You feel ill and have muscle aches and pains. Get help right away if:  Your symptoms get worse.  You feel very sleepy.  You throw up (vomit) or have watery poop (diarrhea) for a long time.  You see red streaks coming from the area.  Your red area gets larger.  Your red area turns dark in color. These symptoms may represent a serious  problem that is an emergency. Do not wait to see if the symptoms will go away. Get medical help right away. Call your local emergency services (911 in the [place] Do not drive yourself to the hospital. Summary  Cellulitis is a skin infection. The area is often warm, red, swollen, and sore.  This condition is treated with medicines, rest, and cold and warm cloths.  Take all medicines only as told by your doctor.  Tell your doctor if symptoms do not start to get better after 1-2 days of treatment. This information is not intended to replace advice given to you by your health care provider. Make sure you discuss any questions you have with your health care provider. Document Released: 04/17/2008 Document Revised: 03/21/2018 Document Reviewed: 03/21/2018 Elsevier Interactive Patient Education  2019 [employer].

## 2019-04-01 NOTE — ED Triage Notes (Signed)
Pt states he may have been bitten by a snake yesterday states he noticed some swelling to the left foot yesterday and is now having swelling up to the knee.

## 2019-04-01 NOTE — H&P (Signed)
[facility] [place] NAME: Larry Parrish    MR#:  [id number]  DATE OF BIRTH:  01/30/1976  DATE OF ADMISSION:  04/01/2019  PRIMARY CARE PHYSICIAN: Patient, No Pcp Per   REQUESTING/REFERRING PHYSICIAN: Dr. Kerman Passey  CHIEF COMPLAINT:   Chief Complaint  Patient presents with  . Leg Swelling    HISTORY OF PRESENT ILLNESS:  Larry Parrish  is a 43 y.o. male with a known history of alcohol abuse in the past, peptic ulcer disease, pancreatitis presented to the emergency room concerned with swelling and redness of his left foot and leg.  Patient mentions that he was at his mother's house.  He has seen many snakes in the past but mostly grass snakes.  Today he did not see a snake or have any sting.  But did notice redness and swelling in his leg and presented to the emergency room.  Here poison control was contacted and after seeing the pictures his left leg swelling and redness was thought to be more of cellulitis than a snakebite.  CroFab was not advised.  CBC, PT, APTT are normal.  CK level elevated at 870.  PAST MEDICAL HISTORY:   Past Medical History:  Diagnosis Date  . GI bleed   . Hepatitis C   . History of pancreatitis     PAST SURGICAL HISTORY:   Past Surgical History:  Procedure Laterality Date  . ESOPHAGOGASTRODUODENOSCOPY (EGD) WITH PROPOFOL N/A 10/21/2015   Procedure: ESOPHAGOGASTRODUODENOSCOPY (EGD) WITH PROPOFOL;  Surgeon: Lucilla Lame, MD;  Location: [facility];  Service: Endoscopy;  Laterality: N/A;  . HAND SURGERY Left 2008  . I&D EXTREMITY Left 10/08/2015   Arm  . TOOTH EXTRACTION      SOCIAL HISTORY:   Social History   Tobacco Use  . Smoking status: Current Every Day Smoker    Packs/day: 0.50    Years: 15.00    Pack years: 7.50    Types: Cigarettes  . Smokeless tobacco: Never Used  Substance Use Topics  . Alcohol use: Not Currently    FAMILY HISTORY:   Family History  Problem Relation Age of  Onset  . Stroke Mother   . Heart disease Mother   . Arthritis Mother   . Cancer Father        Pancreatic  . Arthritis Daughter        Rheumatoid    DRUG ALLERGIES:   Allergies  Allergen Reactions  . Tylenol [Acetaminophen]     Pt has hepatitis C     REVIEW OF SYSTEMS:   Review of Systems  Constitutional: Negative for chills, fever and weight loss.  HENT: Negative for hearing loss and nosebleeds.   Eyes: Negative for blurred vision, double vision and pain.  Respiratory: Negative for cough, hemoptysis, sputum production, shortness of breath and wheezing.   Cardiovascular: Negative for chest pain, palpitations, orthopnea and leg swelling.  Gastrointestinal: Negative for abdominal pain, constipation, diarrhea, nausea and vomiting.  Genitourinary: Negative for dysuria and hematuria.  Musculoskeletal: Negative for back pain, falls and myalgias.  Skin: Negative for rash.  Neurological: Negative for dizziness, tremors, sensory change, speech change, focal weakness, seizures and headaches.  Endo/Heme/Allergies: Does not bruise/bleed easily.  Psychiatric/Behavioral: Negative for depression and memory loss. The patient is not nervous/anxious.    Left leg swelling and redness  MEDICATIONS AT HOME:   Prior to Admission medications   Medication Sig Start Date End Date Taking? Authorizing Provider  cephALEXin (KEFLEX) 500 MG capsule  Take 1 capsule (500 mg total) by mouth 3 (three) times daily. 04/01/19   Carrie Mew, MD  naproxen (NAPROSYN) 500 MG tablet Take 1 tablet (500 mg total) by mouth 2 (two) times daily with a meal. 04/01/19   Carrie Mew, MD  sucralfate (CARAFATE) 1 g tablet Take 1 tablet (1 g total) by mouth 4 (four) times daily. Patient not taking: Reported on 02/16/2017 03/29/16 03/29/17  Daymon Larsen, MD  sulfamethoxazole-trimethoprim (BACTRIM DS) 800-160 MG tablet Take 1 tablet by mouth 2 (two) times daily. 04/01/19   Carrie Mew, MD     VITAL SIGNS:   Blood pressure (!) 143/91, pulse 67, temperature 99.6 F (37.6 C), temperature source Oral, resp. rate 18, height 6\' 1"  (1.854 m), weight 97.1 kg, SpO2 96 %.  PHYSICAL EXAMINATION:  Physical Exam  GENERAL:  43 y.o.-year-old patient lying in the bed with no acute distress.  EYES: Pupils equal, round, reactive to light and accommodation. No scleral icterus. Extraocular muscles intact.  HEENT: Head atraumatic, normocephalic. Oropharynx and nasopharynx clear. No oropharyngeal erythema, moist oral mucosa  NECK:  Supple, no jugular venous distention. No thyroid enlargement, no tenderness.  LUNGS: Normal breath sounds bilaterally, no wheezing, rales, rhonchi. No use of accessory muscles of respiration.  CARDIOVASCULAR: S1, S2 normal. No murmurs, rubs, or gallops.  ABDOMEN: Soft, nontender, nondistended. Bowel sounds present. No organomegaly or mass.  EXTREMITIES: Left foot redness and swelling around the ankle with small ulcers laterally and posteriorly on the ankle.  Redness extending about the knee.  No discharge. NEUROLOGIC: Cranial nerves II through XII are intact. No focal Motor or sensory deficits appreciated b/l PSYCHIATRIC: The patient is alert and oriented x 3. Good affect.  SKIN: No obvious rash, lesion, or ulcer.   LABORATORY PANEL:   CBC Recent Labs  Lab 04/01/19 0819  WBC 9.5  HGB 11.8*  HCT 34.5*  PLT 224   ------------------------------------------------------------------------------------------------------------------  Chemistries  Recent Labs  Lab 04/01/19 0819  NA 135  K 3.6  CL 102  CO2 26  GLUCOSE 134*  BUN 17  CREATININE 0.68  CALCIUM 8.8*  AST 51*  ALT 31  ALKPHOS 53  BILITOT 1.9*   ------------------------------------------------------------------------------------------------------------------  Cardiac Enzymes No results for input(s): TROPONINI in the last 168  hours. ------------------------------------------------------------------------------------------------------------------  RADIOLOGY:  US Venous Img Lower Unilateral Left  Result Date: 04/01/2019 CLINICAL DATA:  Left lower extremity pain and swelling for 1 day EXAM: LEFT LOWER EXTREMITY VENOUS DUPLEX ULTRASOUND TECHNIQUE: Doppler venous assessment of the left lower extremity deep venous system was performed, including characterization of spectral flow, compressibility, and phasicity. COMPARISON:  01/21/2011 FINDINGS: There is complete compressibility of the left common femoral, femoral, and popliteal veins. Doppler analysis demonstrates respiratory phasicity and augmentation of flow with calf compression. No obvious superficial vein or calf vein thrombosis. IMPRESSION: No evidence of left lower extremity DVT. Electronically Signed   By: Marybelle Killings M.D.   On: 04/01/2019 10:57   Dg Chest Portable 1 View  Result Date: 04/01/2019 CLINICAL DATA:  Possible snake bite with foot swelling and cough EXAM: PORTABLE CHEST 1 VIEW COMPARISON:  08/21/2016 FINDINGS: Cardiac shadows within normal limits. The lungs are well aerated bilaterally. Old rib fractures are seen on the right. No focal infiltrate or sizable effusion is noted. No acute bony abnormality is seen. IMPRESSION: No acute abnormality noted. Electronically Signed   By: Inez Catalina M.D.   On: 04/01/2019 09:48     IMPRESSION AND PLAN:   * Left lower extremity  cellulitis vs snake bite No bite marks found and patient did not see the snake. Labs are reassuring. Poison control advised monitoring.  No CroFab. Repeat CBC and coagulation studies. continue IV vancomycin [place] with no DVT  *Rhabdomyolysis.  Start IV fluids.  Repeat CK level in the morning.  DVT prophylaxis.  No heparin or Lovenox ordered as we are monitoring PT and APTT at this time.  All the records are reviewed and case discussed with ED provider. Management plans discussed with  the patient, family and they are in agreement.  CODE STATUS: FULL CODE  TOTAL TIME TAKING CARE OF THIS PATIENT: 40 minutes.   Neita Carp M.D on 04/01/2019 at 5:38 PM  Between 7am to 6pm - Pager - [phone]  After 6pm go to [email or web address] - password [email or web address] [facility]  Office  [phone]  CC: Primary care physician; Patient, No Pcp Per  Note: This dictation was prepared with Dragon dictation along with smaller phrase technology. Any transcriptional errors that result from this process are unintentional.

## 2019-04-01 NOTE — ED Notes (Signed)
Report give to [facility], Therapist, sports.

## 2019-04-01 NOTE — ED Provider Notes (Signed)
-----------------------------------------   5:16 PM on 04/01/2019 -----------------------------------------  Patient care assumed from Dr. Joni Fears.  I have sent pictures to poison control, they have evaluated they recommend holding off on CroFab at this time.  Is not entirely clear the patient was bit by a snake, the other possibility would be cellulitis however given the acuteness of the presentation I suspect likely envenomation.  We will continue to closely monitor the patient.  We will dose IV vancomycin to cover for cellulitis as well as check blood cultures.  We will swab for coronavirus and continue to closely monitor.  Patient will be admitted to the hospitalist service for close monitoring pain control and continued IV antibiotics until the clinical picture becomes more clear.   Harvest Dark, MD 04/01/19 [phone]

## 2019-04-01 NOTE — ED Provider Notes (Signed)
[facility] Emergency Department Provider Note  ____________________________________________  Time seen: Approximately 10:29 AM  I have reviewed the triage vital signs and the nursing notes.   HISTORY  Chief Complaint Leg Swelling    HPI Larry Parrish is a 43 y.o. male  With a history of pancreatitis and hepatitis C, alcohol abuse, gastritis who complains of left leg pain and swelling that started about 1 PM yesterday.  When he noticed that, he thought he got a glimpse of something slipping away in the grass, but he did not actually visualize a snake.  He did not specifically see a copperhead or cottonmouth in the area, but does frequently find snakes around his house.  Denies fevers chills sweats weakness chest pain or shortness of breath.  Severe pain from the left ankle up to the left knee.  Feels that the swelling is gradually worsening over the last day.  Denies any bleeding black or bloody stool.  No other injuries.  Symptoms are constant, severe, worse with movement of the leg, no alleviating factors.    Past Medical History:  Diagnosis Date  . GI bleed   . Hepatitis C   . History of pancreatitis      Patient Active Problem List   Diagnosis Date Noted  . Abdominal pain, epigastric   . Reflux esophagitis   . Gastritis   . Abdominal pain, chronic, epigastric 10/13/2015  . Abscess of left arm   . Chronic hepatitis C without hepatic coma ([place])   . Drug abuse ([place])   . AA (alcohol abuse) 11/16/2014  . Hepatitis 09/28/2014  . HCV (hepatitis C virus) 08/12/2014  . Intravenous drug user 07/30/2014     Past Surgical History:  Procedure Laterality Date  . ESOPHAGOGASTRODUODENOSCOPY (EGD) WITH PROPOFOL N/A 10/21/2015   Procedure: ESOPHAGOGASTRODUODENOSCOPY (EGD) WITH PROPOFOL;  Surgeon: Lucilla Lame, MD;  Location: [facility];  Service: Endoscopy;  Laterality: N/A;  . HAND SURGERY Left 2008  . I&D EXTREMITY Left 10/08/2015   Arm  .  TOOTH EXTRACTION       Prior to Admission medications   Medication Sig Start Date End Date Taking? Authorizing Provider  cephALEXin (KEFLEX) 500 MG capsule Take 1 capsule (500 mg total) by mouth 3 (three) times daily. 04/01/19   Carrie Mew, MD  naproxen (NAPROSYN) 500 MG tablet Take 1 tablet (500 mg total) by mouth 2 (two) times daily with a meal. 04/01/19   Carrie Mew, MD  sucralfate (CARAFATE) 1 g tablet Take 1 tablet (1 g total) by mouth 4 (four) times daily. Patient not taking: Reported on 02/16/2017 03/29/16 03/29/17  Daymon Larsen, MD  sulfamethoxazole-trimethoprim (BACTRIM DS) 800-160 MG tablet Take 1 tablet by mouth 2 (two) times daily. 04/01/19   Carrie Mew, MD     Allergies Tylenol [acetaminophen]   Family History  Problem Relation Age of Onset  . Stroke Mother   . Heart disease Mother   . Arthritis Mother   . Cancer Father        Pancreatic  . Arthritis Daughter        Rheumatoid    Social History Social History   Tobacco Use  . Smoking status: Current Every Day Smoker    Packs/day: 0.50    Years: 15.00    Pack years: 7.50    Types: Cigarettes  . Smokeless tobacco: Never Used  Substance Use Topics  . Alcohol use: Not Currently  . Drug use: Yes    Types: Methamphetamines, Marijuana,  Cocaine    Review of Systems  Constitutional:   No fever or chills.  ENT:   No sore throat. No rhinorrhea. Cardiovascular:   No chest pain or syncope. Respiratory:   No dyspnea or cough. Gastrointestinal:   Negative for abdominal pain, vomiting and diarrhea.  Musculoskeletal: Left leg pain and swelling as above All other systems reviewed and are negative except as documented above in ROS and HPI.  ____________________________________________   PHYSICAL EXAM:  VITAL SIGNS: ED Triage Vitals  Enc Vitals Group     BP 04/01/19 0801 116/79     Pulse Rate 04/01/19 0801 (!) 109     Resp 04/01/19 0801 17     Temp 04/01/19 0801 99.6 F (37.6 C)     Temp  Source 04/01/19 0801 Oral     SpO2 04/01/19 0801 94 %     Weight 04/01/19 0759 214 lb (97.1 kg)     Height 04/01/19 0759 6\' 1"  (1.854 m)     Head Circumference --      Peak Flow --      Pain Score 04/01/19 0759 10     Pain Loc --      Pain Edu? --      Excl. in [place]? --     Vital signs reviewed, nursing assessments reviewed.   Constitutional:   Alert and oriented. Non-toxic appearance. Eyes:   Conjunctivae are normal. EOMI. PERRL. ENT      Head:   Normocephalic and atraumatic.      Nose:   No congestion/rhinnorhea.       Mouth/Throat:   MMM, no pharyngeal erythema. No peritonsillar mass.       Neck:   No meningismus. Full ROM. Hematological/Lymphatic/Immunilogical:   No cervical lymphadenopathy. Cardiovascular:   Tachycardia heart rate 105. Symmetric bilateral radial and DP pulses.  No murmurs. Cap refill less than 2 seconds. Respiratory:   Normal respiratory effort without tachypnea/retractions. Breath sounds are clear and equal bilaterally. No wheezes/rales/rhonchi. Gastrointestinal:   Soft and nontender. Non distended. There is no CVA tenderness.  No rebound, rigidity, or guarding.  Musculoskeletal: Diffuse swelling erythema and tenderness of the left foot ankle and calf up to the knee.  Thigh is unaffected.  No crepitus or streaking.  No warmth or purulent drainage.  He has 2 small wounds each about 1 cm, linear, very superficial, over the inferior edge of the lateral malleolus and over the Achilles tendon insertion site.  Patient reports this is where his shoe rubs on his foot and not due to any injury or bite.  No identifiable fang marks or other wound.  No drainage.  No focal joint tenderness.  Normal distal perfusion in the left foot and toes. Neurologic:   Normal speech and language.  Motor grossly intact. No acute focal neurologic deficits are appreciated.  Skin:    Skin is warm, dry and intact.  There is swelling and erythema diffusely of the left lower leg from the knee  down. ____________________________________________    LABS (pertinent positives/negatives) (all labs ordered are listed, but only abnormal results are displayed) Labs Reviewed  COMPREHENSIVE METABOLIC PANEL - Abnormal; Notable for the following components:      Result Value   Glucose, Bld 134 (*)    Calcium 8.8 (*)    AST 51 (*)    Total Bilirubin 1.9 (*)    All other components within normal limits  CBC WITH DIFFERENTIAL/PLATELET - Abnormal; Notable for the following components:   RBC 3.81 (*)  Hemoglobin 11.8 (*)    HCT 34.5 (*)    All other components within normal limits  FIBRINOGEN - Abnormal; Notable for the following components:   Fibrinogen 554 (*)    All other components within normal limits  CK - Abnormal; Notable for the following components:   Total CK 890 (*)    All other components within normal limits  URINALYSIS, COMPLETE (UACMP) WITH MICROSCOPIC - Abnormal; Notable for the following components:   Color, Urine YELLOW (*)    APPearance CLEAR (*)    Ketones, ur 5 (*)    All other components within normal limits  FIBRIN DERIVATIVES D-DIMER ([facility] ONLY) - Abnormal; Notable for the following components:   Fibrin derivatives D-dimer [facility]) 669.53 (*)    All other components within normal limits  LIPASE, BLOOD  PROTIME-INR  APTT   ____________________________________________   EKG  Interpreted by me Sinus rhythm rate of 92, normal axis intervals QRS ST segments and T waves  ____________________________________________    RADIOLOGY  US Venous Img Lower Unilateral Left  Result Date: 04/01/2019 CLINICAL DATA:  Left lower extremity pain and swelling for 1 day EXAM: LEFT LOWER EXTREMITY VENOUS DUPLEX ULTRASOUND TECHNIQUE: Doppler venous assessment of the left lower extremity deep venous system was performed, including characterization of spectral flow, compressibility, and phasicity. COMPARISON:  01/21/2011 FINDINGS: There is complete compressibility of the  left common femoral, femoral, and popliteal veins. Doppler analysis demonstrates respiratory phasicity and augmentation of flow with calf compression. No obvious superficial vein or calf vein thrombosis. IMPRESSION: No evidence of left lower extremity DVT. Electronically Signed   By: Marybelle Killings M.D.   On: 04/01/2019 10:57   Dg Chest Portable 1 View  Result Date: 04/01/2019 CLINICAL DATA:  Possible snake bite with foot swelling and cough EXAM: PORTABLE CHEST 1 VIEW COMPARISON:  08/21/2016 FINDINGS: Cardiac shadows within normal limits. The lungs are well aerated bilaterally. Old rib fractures are seen on the right. No focal infiltrate or sizable effusion is noted. No acute bony abnormality is seen. IMPRESSION: No acute abnormality noted. Electronically Signed   By: Inez Catalina M.D.   On: 04/01/2019 09:48    ____________________________________________   PROCEDURES Procedures  ____________________________________________  DIFFERENTIAL DIAGNOSIS   Snakebite envenomation, DVT, cellulitis.  Doubt osteomyelitis or necrotizing fasciitis.  Doubt septic arthritis.  Unlikely gout.  CLINICAL IMPRESSION / ASSESSMENT AND PLAN / ED COURSE  Medications ordered in the ED: Medications  sodium chloride 0.9 % bolus 1,000 mL (0 mLs Intravenous Stopped 04/01/19 1111)  fentaNYL (SUBLIMAZE) injection 100 mcg (100 mcg Intravenous Given 04/01/19 0924)  sodium chloride 0.9 % bolus 1,000 mL (1,000 mLs Intravenous New Bag/Given 04/01/19 1427)    Pertinent labs & imaging results that were available during my care of the patient were reviewed by me and considered in my medical decision making (see chart for details).  Larry Parrish was evaluated in Emergency Department on 04/01/2019 for the symptoms described in the history of present illness. He was evaluated in the context of the global COVID-19 pandemic, which necessitated consideration that the patient might be at risk for infection with the SARS-CoV-2 virus  that causes COVID-19. Institutional protocols and algorithms that pertain to the evaluation of patients at risk for COVID-19 are in a state of rapid change based on information released by regulatory bodies including the CDC and federal and state organizations. These policies and algorithms were followed during the patient's care in the ED.   Patient presents with left leg pain  and swelling.  Check lab panel to evaluate for possible coagulopathy from envenomation.  Also possibly DVT.  Presentation not currently consistent with compartment syndrome.  His mom works in [facility] which is currently having a COVID-19 outbreak, but he states that she is careful with decontamination when she comes home, and he denies any body aches fevers chest pain or shortness of breath.  Doubt PE, ACS dissection AAA pneumothorax pericarditis.  Will check an ultrasound of the left lower extremity.  Clinical Course as of Apr 01 1531  Tue Apr 01, 2019  1257 Patient reports he has had 2 tetanus immunizations in the past 18 months.Labs significant for elevated fibrinogen, CK, and d-dimer.  Doubt PE AAA or dissection.  Ultrasound negative for DVT.  On reassessment, leg is not continuing to become more erythematous or swollen.  Compartments remain soft with good distal perfusion and warmth and sensation.  No signs of compartment syndrome at this time.  Will contact poison control regarding possible snakebite 24 hours ago with these lab abnormalities.  May also be related to cellulitis and acute phase reaction.  I will plan to start him on antibiotics and refer for outpatient follow-up unless poison control recommends continued observation and management of potential snakebite.  The patient notes that he has experience with cottonmouth and copper heads that he has kept as Pets and captured on his property in the past, and the 3 snakes that he found in his yard yesterday after the onset of symptoms do not match the appearance of  copperhead or cottonmouth.   [PS]    Clinical Course User Index [PS] Carrie Mew, MD    ----------------------------------------- 3:32 PM on 04/01/2019 -----------------------------------------  Care of patient signed out to Dr. Kerman Passey to follow-up on poison control recommendations.   ____________________________________________   FINAL CLINICAL IMPRESSION(S) / ED DIAGNOSES    Final diagnoses:  Left leg cellulitis  Left leg swelling  Snake bite, initial encounter     ED Discharge Orders         Ordered    sulfamethoxazole-trimethoprim (BACTRIM DS) 800-160 MG tablet  2 times daily     04/01/19 1531    cephALEXin (KEFLEX) 500 MG capsule  3 times daily     04/01/19 1531    naproxen (NAPROSYN) 500 MG tablet  2 times daily with meals     04/01/19 1531          Portions of this note were generated with dragon dictation software. Dictation errors may occur despite best attempts at proofreading.   Carrie Mew, MD 04/01/19 [phone]

## 2019-04-01 NOTE — ED Notes (Signed)
Left  Foot - 10.75" Ankle - 9.75" Calf - 15.75" Thigh - 16.75"  Right  Foot - 10.25" Ankle - 9.0" Calf - 15.25" Thigh - 16.75"  No pain while sitting. Pain resumes with movement.

## 2019-04-01 NOTE — ED Notes (Signed)
X-ray at bedside

## 2019-04-01 NOTE — ED Notes (Signed)
Returned from U/S

## 2019-04-01 NOTE — Progress Notes (Addendum)
Pharmacy Antibiotic Note  Larry Parrish is a 43 y.o. male admitted on 04/01/2019 with cellulitis.  Pharmacy has been consulted for Zosyn, Vancomycin dosing.  Plan: Zosyn 3.375 gm IV Q8H EI ordered to start on 5/19 @ 1800.  Vancomycin 2500 mg IV X 1 ordered to be given on 5/19 @ 1900. Vancomycin 1750 mg IV Q12H ordered to start on 5/20 @ 0700   CrCl = 120 ml/min Ke = 0.117 hr-1 T1/2 = 5.9 hrs  AUC = 488 Vanc trough = 11.7   Height: 6\' 1"  (185.4 cm) Weight: 214 lb (97.1 kg) IBW/kg (Calculated) : 79.9  Temp (24hrs), Avg:99.6 F (37.6 C), Min:99.6 F (37.6 C), Max:99.6 F (37.6 C)  Recent Labs  Lab 04/01/19 0819  WBC 9.5  CREATININE 0.68    Estimated Creatinine Clearance: 147.7 mL/min (by C-G formula based on SCr of 0.68 mg/dL).    Allergies  Allergen Reactions  . Tylenol [Acetaminophen]     Pt has hepatitis C     Antimicrobials this admission:   >>    >>   Dose adjustments this admission:   Microbiology results:  BCx:   UCx:    Sputum:    MRSA PCR:   Thank you for allowing pharmacy to be a part of this patient's care.  Manali Mcelmurry D 04/01/2019 6:19 PM

## 2019-04-01 NOTE — ED Notes (Signed)
Received call from [place], RN at [place] control. She requested that photos of the patient's leg and foot be sent to [email or web address].

## 2019-04-01 NOTE — ED Notes (Signed)
Right leg Ankle- 9.5 inch Calf- 15.5 inch Thigh- 17 inch  Left leg Ankle- 10 inch Calf- 16 inch Thigh- 16 7/8 inch

## 2019-04-01 NOTE — ED Notes (Addendum)
Poison Control called. Spoke to [place], Therapist, sports. Advised to elevate foot above heart and begin measuring foot, ankle, calf, and thigh hourly. She further advised to administer fluids to patient. Beth to fax guidelines related to snake bites to [facility] ED. She will call back after speaking to toxicology.

## 2019-04-01 NOTE — ED Notes (Signed)
Patient encouraged to provide urine sample.

## 2019-04-01 NOTE — ED Notes (Signed)
ED TO INPATIENT HANDOFF REPORT  ED Nurse Name and Phone #:  [phone]  S Name/Age/Gender Larry Parrish 43 y.o. male Room/Bed: ED03A/ED03A  Code Status   Code Status: Full Code  Home/SNF/Other Home Patient oriented to: self, place, time and situation Is this baseline? Yes   Triage Complete: Triage complete  Chief Complaint left leg swelling  Triage Note Pt states he may have been bitten by a snake yesterday states he noticed some swelling to the left foot yesterday and is now having swelling up to the knee.    Allergies Allergies  Allergen Reactions  . Tylenol [Acetaminophen]     Pt has hepatitis C     Level of Care/Admitting Diagnosis ED Disposition    ED Disposition Condition [facility] Area: [place] [[id number]]  Level of Care: Med-Surg [16]  Covid Evaluation: N/A  Diagnosis: Cellulitis, leg [001749]  Admitting Physician: Hillary Bow [[id number]]  Attending Physician: Hillary Bow [[id number]]  Estimated length of stay: past midnight tomorrow  Certification:: I certify this patient will need inpatient services for at least 2 midnights  PT Class (Do Not Modify): Inpatient [101]  PT Acc Code (Do Not Modify): Private [1]       B Medical/Surgery History Past Medical History:  Diagnosis Date  . GI bleed   . Hepatitis C   . History of pancreatitis    Past Surgical History:  Procedure Laterality Date  . ESOPHAGOGASTRODUODENOSCOPY (EGD) WITH PROPOFOL N/A 10/21/2015   Procedure: ESOPHAGOGASTRODUODENOSCOPY (EGD) WITH PROPOFOL;  Surgeon: Lucilla Lame, MD;  Location: [facility];  Service: Endoscopy;  Laterality: N/A;  . HAND SURGERY Left 2008  . I&D EXTREMITY Left 10/08/2015   Arm  . TOOTH EXTRACTION       A IV Location/Drains/Wounds Patient Lines/Drains/Airways Status   Active Line/Drains/Airways    Name:   Placement date:   Placement time:   Site:   Days:   Peripheral IV 04/01/19 Left Antecubital    04/01/19    0815    Antecubital   less than 1          Intake/Output Last 24 hours  Intake/Output Summary (Last 24 hours) at 04/01/2019 1806 Last data filed at 04/01/2019 1111 Gross per 24 hour  Intake 999 ml  Output -  Net 999 ml    Labs/Imaging Results for orders placed or performed during the hospital encounter of 04/01/19 (from the past 48 hour(s))  Comprehensive metabolic panel     Status: Abnormal   Collection Time: 04/01/19  8:19 AM  Result Value Ref Range   Sodium 135 135 - 145 mmol/L   Potassium 3.6 3.5 - 5.1 mmol/L   Chloride 102 98 - 111 mmol/L   CO2 26 22 - 32 mmol/L   Glucose, Bld 134 (H) 70 - 99 mg/dL   BUN 17 6 - 20 mg/dL   Creatinine, Ser 0.68 0.61 - 1.24 mg/dL   Calcium 8.8 (L) 8.9 - 10.3 mg/dL   Total Protein 7.3 6.5 - 8.1 g/dL   Albumin 4.1 3.5 - 5.0 g/dL   AST 51 (H) 15 - 41 U/L   ALT 31 0 - 44 U/L   Alkaline Phosphatase 53 38 - 126 U/L   Total Bilirubin 1.9 (H) 0.3 - 1.2 mg/dL   GFR calc non Af Amer >60 >60 mL/min   GFR calc Af Amer >60 >60 mL/min   Anion gap 7 5 - 15    Comment: Performed at  [facility], [address] [place], [place] [address]  Lipase, blood     Status: None   Collection Time: 04/01/19  8:19 AM  Result Value Ref Range   Lipase 23 11 - 51 U/L    Comment: Performed at [facility], [address] [place], [place] [address]  CBC with Differential     Status: Abnormal   Collection Time: 04/01/19  8:19 AM  Result Value Ref Range   WBC 9.5 4.0 - 10.5 K/uL   RBC 3.81 (L) 4.22 - 5.81 MIL/uL   Hemoglobin 11.8 (L) 13.0 - 17.0 g/dL   HCT 34.5 (L) 39.0 - 52.0 %   MCV 90.6 80.0 - 100.0 fL   MCH 31.0 26.0 - 34.0 pg   MCHC 34.2 30.0 - 36.0 g/dL   RDW 11.8 11.5 - 15.5 %   Platelets 224 150 - 400 K/uL   nRBC 0.0 0.0 - 0.2 %   Neutrophils Relative % 76 %   Neutro Abs 7.3 1.7 - 7.7 K/uL   Lymphocytes Relative 13 %   Lymphs Abs 1.3 0.7 - 4.0 K/uL   Monocytes Relative 10 %   Monocytes Absolute 0.9 0.1 - 1.0 K/uL    Eosinophils Relative 1 %   Eosinophils Absolute 0.1 0.0 - 0.5 K/uL   Basophils Relative 0 %   Basophils Absolute 0.0 0.0 - 0.1 K/uL   Immature Granulocytes 0 %   Abs Immature Granulocytes 0.03 0.00 - 0.07 K/uL    Comment: Performed at [facility], [address] [place], [place] [address]  Protime-INR     Status: None   Collection Time: 04/01/19  8:19 AM  Result Value Ref Range   Prothrombin Time 13.1 11.4 - 15.2 seconds   INR 1.0 0.8 - 1.2    Comment: (NOTE) INR goal varies based on device and disease states. Performed at [facility], [address] [place], [place] [address]   Fibrinogen     Status: Abnormal   Collection Time: 04/01/19  8:19 AM  Result Value Ref Range   Fibrinogen 554 (H) 210 - 475 mg/dL    Comment: Performed at [facility], [address] [place], [place] [address]  APTT     Status: None   Collection Time: 04/01/19  8:19 AM  Result Value Ref Range   aPTT 32 24 - 36 seconds    Comment: Performed at [facility], [address] [place], [place] [address]  CK     Status: Abnormal   Collection Time: 04/01/19  8:19 AM  Result Value Ref Range   Total CK 890 (H) 49 - 397 U/L    Comment: Performed at [facility], [address] [place], [place] [address]  Fibrin derivatives D-Dimer     Status: Abnormal   Collection Time: 04/01/19  8:19 AM  Result Value Ref Range   Fibrin derivatives D-dimer ([facility]) 669.53 (H) 0.00 - 499.00 ng/mL (FEU)    Comment: (NOTE) <> Exclusion of Venous Thromboembolism (VTE) - OUTPATIENT ONLY   (Emergency Department or [place])   0-499 ng/ml (FEU): With a low to intermediate pretest probability                      for VTE this test result excludes the diagnosis                      of VTE.   >499 ng/ml (FEU) : VTE  not excluded; additional work up for VTE is                      required. <> Testing on Inpatients and Evaluation of Disseminated Intravascular   Coagulation (DIC)  Reference Range:   0-499 ng/ml (FEU) Performed at [facility], [address] [place], [place] [address]   Urinalysis, Complete w Microscopic     Status: Abnormal   Collection Time: 04/01/19 12:32 PM  Result Value Ref Range   Color, Urine YELLOW (A) YELLOW   APPearance CLEAR (A) CLEAR   Specific Gravity, Urine 1.024 1.005 - 1.030   pH 6.0 5.0 - 8.0   Glucose, UA NEGATIVE NEGATIVE mg/dL   Hgb urine dipstick NEGATIVE NEGATIVE   Bilirubin Urine NEGATIVE NEGATIVE   Ketones, ur 5 (A) NEGATIVE mg/dL   Protein, ur NEGATIVE NEGATIVE mg/dL   Nitrite NEGATIVE NEGATIVE   Leukocytes,Ua NEGATIVE NEGATIVE   RBC / HPF 0-5 0 - 5 RBC/hpf   WBC, UA 0-5 0 - 5 WBC/hpf   Bacteria, UA NONE SEEN NONE SEEN   Squamous Epithelial / LPF 0-5 0 - 5   Mucus PRESENT     Comment: Performed at [facility], [address] [place], [place] [address]   US Venous Img Lower Unilateral Left  Result Date: 04/01/2019 CLINICAL DATA:  Left lower extremity pain and swelling for 1 day EXAM: LEFT LOWER EXTREMITY VENOUS DUPLEX ULTRASOUND TECHNIQUE: Doppler venous assessment of the left lower extremity deep venous system was performed, including characterization of spectral flow, compressibility, and phasicity. COMPARISON:  01/21/2011 FINDINGS: There is complete compressibility of the left common femoral, femoral, and popliteal veins. Doppler analysis demonstrates respiratory phasicity and augmentation of flow with calf compression. No obvious superficial vein or calf vein thrombosis. IMPRESSION: No evidence of left lower extremity DVT. Electronically Signed   By: Marybelle Killings M.D.   On: 04/01/2019 10:57   Dg Chest Portable 1 View  Result Date: 04/01/2019 CLINICAL DATA:  Possible snake bite with foot swelling and cough EXAM: PORTABLE CHEST 1 VIEW COMPARISON:  08/21/2016 FINDINGS: Cardiac shadows within normal limits. The lungs are well aerated bilaterally. Old rib fractures are seen on the right. No focal  infiltrate or sizable effusion is noted. No acute bony abnormality is seen. IMPRESSION: No acute abnormality noted. Electronically Signed   By: Inez Catalina M.D.   On: 04/01/2019 09:48    Pending Labs Unresulted Labs (From admission, onward)    Start     Ordered   04/02/19 0500  Comprehensive metabolic panel  Tomorrow morning,   STAT     04/01/19 1734   04/02/19 0500  CBC  Tomorrow morning,   STAT     04/01/19 1734   04/02/19 0500  Protime-INR  Tomorrow morning,   STAT     04/01/19 1734   04/02/19 0500  APTT  Tomorrow morning,   STAT     04/01/19 1734   04/02/19 0500  CK  Tomorrow morning,   STAT     04/01/19 1735   04/01/19 2200  CBC  Once-Timed,   STAT     04/01/19 1739   04/01/19 2200  Protime-INR  Once-Timed,   STAT     04/01/19 1739   04/01/19 2200  APTT  Once-Timed,   STAT     04/01/19 1739   04/01/19 1734  HIV antibody (Routine Testing)  Add-on,   AD     04/01/19 1734   04/01/19  [place] (CEPHEID - Performed in [place] hospital lab), Hosp Order  (Asymptomatic Patients Labs)  Once,   STAT    Question:  Rule Out  Answer:  Yes   04/01/19 1716   04/01/19 1713  Blood culture (routine x 2)  BLOOD CULTURE X 2,   STAT     04/01/19 1712          Vitals/Pain Today's Vitals   04/01/19 1430 04/01/19 1500 04/01/19 1649 04/01/19 1649  BP: (!) 128/95 119/84  (!) 143/91  Pulse: 80 82  67  Resp: 14 20  18   Temp:      TempSrc:      SpO2: 100% 100%  96%  Weight:      Height:      PainSc:   10-Worst pain ever     Isolation Precautions No active isolations  Medications Medications  vancomycin (VANCOCIN) IVPB 1000 mg/200 mL premix (has no administration in time range)  sodium chloride flush (NS) 0.9 % injection 3 mL (has no administration in time range)  acetaminophen (TYLENOL) tablet 650 mg (has no administration in time range)    Or  acetaminophen (TYLENOL) suppository 650 mg (has no administration in time range)  traMADol (ULTRAM) tablet 50 mg (has no  administration in time range)  polyethylene glycol (MIRALAX / GLYCOLAX) packet 17 g (has no administration in time range)  ondansetron (ZOFRAN) tablet 4 mg (has no administration in time range)    Or  ondansetron (ZOFRAN) injection 4 mg (has no administration in time range)  lactated ringers infusion (has no administration in time range)  vancomycin (VANCOCIN) 2,500 mg in sodium chloride 0.9 % 500 mL IVPB (has no administration in time range)  sodium chloride 0.9 % bolus 1,000 mL (0 mLs Intravenous Stopped 04/01/19 1111)  fentaNYL (SUBLIMAZE) injection 100 mcg (100 mcg Intravenous Given 04/01/19 0924)  sodium chloride 0.9 % bolus 1,000 mL (1,000 mLs Intravenous New Bag/Given 04/01/19 1427)    Mobility walks Low fall risk   Focused Assessments Cardiac Assessment Handoff:  Cardiac Rhythm: Normal sinus rhythm Lab Results  Component Value Date   CKTOTAL 890 (H) 04/01/2019   CKMB 0.8 11/13/2014   TROPONINI <0.03 07/26/2016   No results found for: DDIMER Does the Patient currently have chest pain? No     R Recommendations: See Admitting Provider Note  Report given to:   Additional Notes:  Beth at poison control advised to elevate patient's left foot above the level of the heart. Measurements are to be taken of the patient's foot, ankle, calf, and thigh at regular intervals.

## 2019-04-02 LAB — CBC
HCT: 33.2 % — ABNORMAL LOW (ref 39.0–52.0)
Hemoglobin: 11 g/dL — ABNORMAL LOW (ref 13.0–17.0)
MCH: 30.8 pg (ref 26.0–34.0)
MCHC: 33.1 g/dL (ref 30.0–36.0)
MCV: 93 fL (ref 80.0–100.0)
Platelets: 205 10*3/uL (ref 150–400)
RBC: 3.57 MIL/uL — ABNORMAL LOW (ref 4.22–5.81)
RDW: 11.9 % (ref 11.5–15.5)
WBC: 4.5 10*3/uL (ref 4.0–10.5)
nRBC: 0 % (ref 0.0–0.2)

## 2019-04-02 LAB — COMPREHENSIVE METABOLIC PANEL
ALT: 24 U/L (ref 0–44)
AST: 28 U/L (ref 15–41)
Albumin: 3.2 g/dL — ABNORMAL LOW (ref 3.5–5.0)
Alkaline Phosphatase: 41 U/L (ref 38–126)
Anion gap: 7 (ref 5–15)
BUN: 8 mg/dL (ref 6–20)
CO2: 26 mmol/L (ref 22–32)
Calcium: 8 mg/dL — ABNORMAL LOW (ref 8.9–10.3)
Chloride: 108 mmol/L (ref 98–111)
Creatinine, Ser: 0.64 mg/dL (ref 0.61–1.24)
GFR calc Af Amer: >60 mL/min (ref >60)
GFR calc non Af Amer: >60 mL/min (ref >60)
Glucose, Bld: 104 mg/dL — ABNORMAL HIGH (ref 70–99)
Potassium: 3.6 mmol/L (ref 3.5–5.1)
Sodium: 141 mmol/L (ref 135–145)
Total Bilirubin: 0.9 mg/dL (ref 0.3–1.2)
Total Protein: 5.9 g/dL — ABNORMAL LOW (ref 6.5–8.1)

## 2019-04-02 LAB — PROTIME-INR
INR: 1 (ref 0.8–1.2)
Prothrombin Time: 13.4 seconds (ref 11.4–15.2)

## 2019-04-02 LAB — APTT: aPTT: 33 seconds (ref 24–36)

## 2019-04-02 LAB — CK: Total CK: 240 U/L (ref 49–397)

## 2019-04-02 MED ORDER — SODIUM CHLORIDE 0.9 % IV SOLN: 4.0000 | Freq: Once | INTRAVENOUS | Status: DC

## 2019-04-02 NOTE — Progress Notes (Signed)
Left   Foot-     27.3 cm    27.8 cm     28 cm Ankle-    24.7 cm    24.8cm      24.5 cm Calf-       40 cm       40 cm        39 cm Thigh-     43 cm       42.5cm      42.2 cm   Right Foot-      25 cm Ankle-     23 cm Calf-        37 cm Thigh-      42.5 cm

## 2019-04-02 NOTE — Progress Notes (Addendum)
Left leg                            7 am          1 pm             6 pm  Foot               28 cm          28 cm           28 cm Ankle              24 cm          24 cm           24 cm Calf                38.5 cm       38.5 cm        38.5 cm Thigh              40.5 cm       40 cm           40.5 cm

## 2019-04-02 NOTE — Progress Notes (Signed)
[facility] [place] NAME: Larry Parrish    MR#:  [id number]  DATE OF BIRTH:  November 15, 1975  SUBJECTIVE:   Patient admitted to the hospital due to right leg swelling and redness and suspected to have left leg cellulitis.  REVIEW OF SYSTEMS:    Review of Systems  Constitutional: Negative for chills and fever.  HENT: Negative for congestion and tinnitus.   Eyes: Negative for blurred vision and double vision.  Respiratory: Negative for cough, shortness of breath and wheezing.   Cardiovascular: Negative for chest pain, orthopnea and PND.  Gastrointestinal: Negative for abdominal pain, diarrhea, nausea and vomiting.  Genitourinary: Negative for dysuria and hematuria.  Neurological: Negative for dizziness, sensory change and focal weakness.  All other systems reviewed and are negative.   Nutrition: Regular Tolerating Diet: Yes Tolerating PT: Ambulatory  DRUG ALLERGIES:   Allergies  Allergen Reactions  . Tylenol [Acetaminophen]     Pt has hepatitis C     VITALS:  Blood pressure 114/81, pulse 80, temperature 98.1 F (36.7 C), temperature source Oral, resp. rate 14, height 6\' 1"  (1.854 m), weight 97.1 kg, SpO2 99 %.  PHYSICAL EXAMINATION:   Physical Exam  GENERAL:  43 y.o.-year-old patient lying in bed in no acute distress.  EYES: Pupils equal, round, reactive to light and accommodation. No scleral icterus. Extraocular muscles intact.  HEENT: Head atraumatic, normocephalic. Oropharynx and nasopharynx clear.  NECK:  Supple, no jugular venous distention. No thyroid enlargement, no tenderness.  LUNGS: Normal breath sounds bilaterally, no wheezing, rales, rhonchi. No use of accessory muscles of respiration.  CARDIOVASCULAR: S1, S2 normal. No murmurs, rubs, or gallops.  ABDOMEN: Soft, nontender, nondistended. Bowel sounds present. No organomegaly or mass.  EXTREMITIES: No cyanosis, clubbing or edema b/l.    NEUROLOGIC: Cranial nerves  II through XII are intact. No focal Motor or sensory deficits b/l.   PSYCHIATRIC: The patient is alert and oriented x 3.  SKIN: No obvious rash, lesion, or ulcer.          LABORATORY PANEL:   CBC Recent Labs  Lab 04/02/19 0513  WBC 4.5  HGB 11.0*  HCT 33.2*  PLT 205   ------------------------------------------------------------------------------------------------------------------  Chemistries  Recent Labs  Lab 04/02/19 0513  NA 141  K 3.6  CL 108  CO2 26  GLUCOSE 104*  BUN 8  CREATININE 0.64  CALCIUM 8.0*  AST 28  ALT 24  ALKPHOS 41  BILITOT 0.9   ------------------------------------------------------------------------------------------------------------------  Cardiac Enzymes No results for input(s): TROPONINI in the last 168 hours. ------------------------------------------------------------------------------------------------------------------  RADIOLOGY:  US Venous Img Lower Unilateral Left  Result Date: 04/01/2019 CLINICAL DATA:  Left lower extremity pain and swelling for 1 day EXAM: LEFT LOWER EXTREMITY VENOUS DUPLEX ULTRASOUND TECHNIQUE: Doppler venous assessment of the left lower extremity deep venous system was performed, including characterization of spectral flow, compressibility, and phasicity. COMPARISON:  01/21/2011 FINDINGS: There is complete compressibility of the left common femoral, femoral, and popliteal veins. Doppler analysis demonstrates respiratory phasicity and augmentation of flow with calf compression. No obvious superficial vein or calf vein thrombosis. IMPRESSION: No evidence of left lower extremity DVT. Electronically Signed   By: Marybelle Killings M.D.   On: 04/01/2019 10:57   Dg Chest Portable 1 View  Result Date: 04/01/2019 CLINICAL DATA:  Possible snake bite with foot swelling and cough EXAM: PORTABLE CHEST 1 VIEW COMPARISON:  08/21/2016 FINDINGS: Cardiac shadows within normal limits. The lungs are well aerated bilaterally. Old  rib  fractures are seen on the right. No focal infiltrate or sizable effusion is noted. No acute bony abnormality is seen. IMPRESSION: No acute abnormality noted. Electronically Signed   By: Inez Catalina M.D.   On: 04/01/2019 09:48     ASSESSMENT AND PLAN:   43 year old male with past medical history of hepatitis C, previous history of pancreatitis who presented to the hospital due to left foot swelling and redness.  1.  Left foot swelling and redness-suspected to be secondary cellulitis.  Patient does have a lot of snakes in his backyard but does not remember being bitten by 1. - Hold off on giving CroFab for now. -Continue IV vancomycin, will DC Zosyn for now.  Patient is afebrile, white cell count is normal.  Dopplers of lower extremities were negative for DVT. -Continue supportive care with pain control with tramadol.   All the records are reviewed and case discussed with Care Management/Social Worker. Management plans discussed with the patient, family and they are in agreement.  CODE STATUS: Full code  DVT Prophylaxis: Lovenox  TOTAL TIME TAKING CARE OF THIS PATIENT: 25 minutes.   POSSIBLE D/C IN 1-2 DAYS, DEPENDING ON CLINICAL CONDITION.   Henreitta Leber M.D on 04/02/2019 at 1:57 PM  Between 7am to 6pm - Pager - [phone]  After 6pm go to [email or web address] - Proofreader  [facility]  Office  [phone]  CC: Primary care physician; Patient, No Pcp Per

## 2019-04-02 NOTE — Progress Notes (Signed)
Per poison control pt's legs to continue to be elevated and also do not use ice.

## 2019-04-02 NOTE — Progress Notes (Signed)
Called by nursing to evaluate patient's lower extremity swelling.  Patient admitted here with lower extremity edema and some erythema.  Being treated for cellulitis currently.  There is a somewhat confusing story of possibility of a snakebite.  Patient states that he was at his mother's house and there are a lot of snakes there.  He ports that they are mostly nonvenomous snakes and he and family members will often collect them to translocate them from the property.  However, today he noticed that among the snakes that he had collected 1 of them looked venomous.  He does not recall being bitten, but he states that they were all relatively small snakes and questions whether he may have been bitten without truly realizing it.  He was not initially treated with CroFab, but I have ordered a dose for him now.  On evaluation he still has good DP and PT pulses.  He has brisk distal capillary refill in his toes.  He does report some change in sensation in that foot.  He has no pain with passive flexion maneuvers.  I do not feel like he has developed compartment syndrome at this time.  However, we will keep a close eye on his swelling.  [facility] 04/02/2019, 1:23 AM  Note:  This document was prepared using Dragon voice recognition software and may include unintentional dictation errors.

## 2019-04-02 NOTE — Progress Notes (Signed)
Per MD okay for RN to Dc IVF and crofab order,

## 2019-04-02 NOTE — Progress Notes (Signed)
Left leg 23:00  Foot   28 cm Ankle  24 Calf    39 Thigh  41

## 2019-04-03 LAB — HIV ANTIBODY (ROUTINE TESTING W REFLEX): HIV Screen 4th Generation wRfx: NONREACTIVE

## 2019-04-03 MED ORDER — CEPHALEXIN 500 MG PO CAPS: 500.0000 mg | ORAL_CAPSULE | Freq: Three times a day (TID) | ORAL | 0 refills | Status: DC

## 2019-04-03 NOTE — Progress Notes (Signed)
Left leg  Foot   27 Ankle  24 Calf    38 Thigh  41

## 2019-04-03 NOTE — Discharge Summary (Signed)
[facility] NAME: Larry Parrish    MR#:  [id number]  DATE OF BIRTH:  Mar 17, 1976  DATE OF ADMISSION:  04/01/2019 ADMITTING PHYSICIAN: Hillary Bow, MD  DATE OF DISCHARGE: 04/03/2019  PRIMARY CARE PHYSICIAN: Patient, No Pcp Per   ADMISSION DIAGNOSIS:  Left leg cellulitis [K35.465] Snake bite, initial encounter [W59.11XA] Left leg swelling [M79.89]  DISCHARGE DIAGNOSIS:  Active Problems:   Cellulitis, leg   SECONDARY DIAGNOSIS:   Past Medical History:  Diagnosis Date  . GI bleed   . Hepatitis C   . History of pancreatitis      ADMITTING HISTORY  HISTORY OF PRESENT ILLNESS:  Larry Parrish  is a 43 y.o. male with a known history of alcohol abuse in the past, peptic ulcer disease, pancreatitis presented to the emergency room concerned with swelling and redness of his left foot and leg.  Patient mentions that he was at his mother's house.  He has seen many snakes in the past but mostly grass snakes.  Today he did not see a snake or have any sting.  But did notice redness and swelling in his leg and presented to the emergency room.  Here poison control was contacted and after seeing the pictures his left leg swelling and redness was thought to be more of cellulitis than a snakebite.  CroFab was not advised.  CBC, PT, APTT are normal.  CK level elevated at 870.  HOSPITAL COURSE:   *Left lower extremity cellulitis Patient admitted to medical floor with IV vancomycin and Zosyn.  Slowly improved and Zosyn stopped.  Afebrile and normal WBC.  Snakebite was considered but after contacting poison center since patient did not have any snakebite marks or visible snake at home CroFab was not given.  Patient CBC and coagulation studies were followed and stable.  Patient at time of discharge has significant improvement in redness, pain and swelling.  Ambulating without any problems.  Requesting to be discharged home.  He will be switched to oral  Keflex for 1 more week and follow-up with primary care physician. Ultrasound was checked and showed no DVT or abscess.  *Acute rhabdomyolysis secondary to lower extremity cellulitis.  No compartment syndrome.  Resolved with IV fluids.  Patient stable for discharge home.  CONSULTS OBTAINED:    DRUG ALLERGIES:   Allergies  Allergen Reactions  . Tylenol [Acetaminophen]     Pt has hepatitis C     DISCHARGE MEDICATIONS:   Allergies as of 04/03/2019      Reactions   Tylenol [acetaminophen]    Pt has hepatitis C       Medication List    STOP taking these medications   sucralfate 1 g tablet Commonly known as:  Carafate     TAKE these medications   cephALEXin 500 MG capsule Commonly known as:  KEFLEX Take 1 capsule (500 mg total) by mouth 3 (three) times daily.       Today   VITAL SIGNS:  Blood pressure (!) 132/118, pulse 78, temperature 98.5 F (36.9 C), temperature source Oral, resp. rate 18, height 6\' 1"  (1.854 m), weight 97.1 kg, SpO2 100 %.  I/O:    Intake/Output Summary (Last 24 hours) at 04/03/2019 1255 Last data filed at 04/03/2019 0500 Gross per 24 hour  Intake 1424.39 ml  Output -  Net 1424.39 ml    PHYSICAL EXAMINATION:  Physical Exam  GENERAL:  43 y.o.-year-old patient lying in the bed with no acute distress.  LUNGS: Normal breath sounds bilaterally, no wheezing, rales,rhonchi or crepitation. No use of accessory muscles of respiration.  CARDIOVASCULAR: S1, S2 normal. No murmurs, rubs, or gallops.  ABDOMEN: Soft, non-tender, non-distended. Bowel sounds present. No organomegaly or mass.  NEUROLOGIC: Moves all 4 extremities. PSYCHIATRIC: The patient is alert and oriented x 3.  SKIN: No obvious rash, lesion, or ulcer.   DATA REVIEW:   CBC Recent Labs  Lab 04/02/19 0513  WBC 4.5  HGB 11.0*  HCT 33.2*  PLT 205    Chemistries  Recent Labs  Lab 04/02/19 0513  NA 141  K 3.6  CL 108  CO2 26  GLUCOSE 104*  BUN 8  CREATININE 0.64   CALCIUM 8.0*  AST 28  ALT 24  ALKPHOS 41  BILITOT 0.9    Cardiac Enzymes No results for input(s): TROPONINI in the last 168 hours.  Microbiology Results  Results for orders placed or performed during the hospital encounter of 04/01/19  Blood culture (routine x 2)     Status: None (Preliminary result)   Collection Time: 04/01/19  5:13 PM  Result Value Ref Range Status   Specimen Description BLOOD BLOOD RIGHT FOREARM  Final   Special Requests   Final    BOTTLES DRAWN AEROBIC AND ANAEROBIC Blood Culture results may not be optimal due to an excessive volume of blood received in culture bottles   Culture   Final    NO GROWTH 2 DAYS Performed at [facility], [address] [place], [place] [address]    Report Status PENDING  Incomplete  SARS Coronavirus 2 (CEPHEID - Performed in [facility] hospital lab), Hosp Order     Status: None   Collection Time: 04/01/19  5:17 PM  Result Value Ref Range Status   SARS Coronavirus 2 NEGATIVE NEGATIVE Final    Comment: (NOTE) If result is NEGATIVE SARS-CoV-2 target nucleic acids are NOT DETECTED. The SARS-CoV-2 RNA is generally detectable in upper and lower  respiratory specimens during the acute phase of infection. The lowest  concentration of SARS-CoV-2 viral copies this assay can detect is 250  copies / mL. A negative result does not preclude SARS-CoV-2 infection  and should not be used as the sole basis for treatment or other  patient management decisions.  A negative result may occur with  improper specimen collection / handling, submission of specimen other  than nasopharyngeal swab, presence of viral mutation(s) within the  areas targeted by this assay, and inadequate number of viral copies  (<250 copies / mL). A negative result must be combined with clinical  observations, patient history, and epidemiological information. If result is POSITIVE SARS-CoV-2 target nucleic acids are DETECTED. The SARS-CoV-2 RNA is generally  detectable in upper and lower  respiratory specimens dur ing the acute phase of infection.  Positive  results are indicative of active infection with SARS-CoV-2.  Clinical  correlation with patient history and other diagnostic information is  necessary to determine patient infection status.  Positive results do  not rule out bacterial infection or co-infection with other viruses. If result is PRESUMPTIVE POSTIVE SARS-CoV-2 nucleic acids MAY BE PRESENT.   A presumptive positive result was obtained on the submitted specimen  and confirmed on repeat testing.  While 2019 novel coronavirus  (SARS-CoV-2) nucleic acids may be present in the submitted sample  additional confirmatory testing may be necessary for epidemiological  and / or clinical management purposes  to differentiate between  SARS-CoV-2 and other Sarbecovirus currently known to infect  humans.  If clinically indicated additional testing with an alternate test  methodology [phone]) is advised. The SARS-CoV-2 RNA is generally  detectable in upper and lower respiratory sp ecimens during the acute  phase of infection. The expected result is Negative. Fact Sheet for Patients:  StrictlyIdeas.no Fact Sheet for Healthcare Providers: BankingDealers.co.za This test is not yet approved or cleared by the [place] FDA and has been authorized for detection and/or diagnosis of SARS-CoV-2 by FDA under an Emergency Use Authorization (EUA).  This EUA will remain in effect (meaning this test can be used) for the duration of the COVID-19 declaration under Section 564(b)(1) of the Act, 21 U.S.C. section 360bbb-3(b)(1), unless the authorization is terminated or revoked sooner. Performed at [facility], [address] [place], [place] [address]   Blood culture (routine x 2)     Status: None (Preliminary result)   Collection Time: 04/01/19  5:18 PM  Result Value Ref Range Status    Specimen Description BLOOD LEFT ANTECUBITAL  Final   Special Requests   Final    BOTTLES DRAWN AEROBIC AND ANAEROBIC Blood Culture results may not be optimal due to an excessive volume of blood received in culture bottles   Culture   Final    NO GROWTH 2 DAYS Performed at [facility], [address], [place], [place] [address]    Report Status PENDING  Incomplete    RADIOLOGY:  No results found.  Follow up with PCP in 1 week.  Management plans discussed with the patient, family and they are in agreement.  CODE STATUS:     Code Status Orders  (From admission, onward)         Start     Ordered   04/01/19 1733  Full code  Continuous     04/01/19 1734        Code Status History    This patient has a current code status but no historical code status.    Advance Directive Documentation     Most Recent Value  Type of Advance Directive  Healthcare Power of Attorney, Living will  Pre-existing out of facility DNR order (yellow form or pink MOST form)  -  "MOST" Form in Place?  -      TOTAL TIME TAKING CARE OF THIS PATIENT ON DAY OF DISCHARGE: more than 30 minutes.   Larry Parrish M.D on 04/03/2019 at 12:55 PM  Between 7am to 6pm - Pager - [phone]  After 6pm go to [email or web address] - password [email or web address] [facility]  Office  [phone]  CC: Primary care physician; Patient, No Pcp Per  Note: This dictation was prepared with Dragon dictation along with smaller phrase technology. Any transcriptional errors that result from this process are unintentional.

## 2019-04-03 NOTE — Progress Notes (Signed)
04/03/2019 1:56 PM  Pearlie Oyster to be D/C'd Home per MD order.  Discussed prescriptions and follow up appointments with the patient. Prescriptions given to patient, medication list explained in detail. Pt verbalized understanding.  Allergies as of 04/03/2019      Reactions   Tylenol [acetaminophen]    Pt has hepatitis C       Medication List    STOP taking these medications   sucralfate 1 g tablet Commonly known as:  Carafate     TAKE these medications   cephALEXin 500 MG capsule Commonly known as:  KEFLEX Take 1 capsule (500 mg total) by mouth 3 (three) times daily.       Vitals:   04/03/19 0438 04/03/19 1228  BP: 115/78 (!) 132/118  Pulse: 66 78  Resp: 18   Temp: 98.6 F (37 C) 98.5 F (36.9 C)  SpO2: 97% 100%    Skin clean, dry and intact without evidence of skin break down, no evidence of skin tears noted. IV catheter discontinued intact. Site without signs and symptoms of complications. Dressing and pressure applied. Pt denies pain at this time. No complaints noted.  An After Visit Summary was printed and given to the patient. Patient escorted via [place], and D/C home via private auto.  Dola Argyle

## 2019-04-06 LAB — CULTURE, BLOOD (ROUTINE X 2)
Culture: NO GROWTH
Culture: NO GROWTH

## 2019-09-17 ENCOUNTER — Ambulatory Visit: Payer: Self-pay | Admitting: General Surgery

## 2019-09-17 NOTE — H&P (View-Only) (Signed)
PATIENT PROFILE: Larry Parrish is a 43 y.o. male who presents to the Clinic for evaluation of right inguinal hernia.  PCP:  [facility] [place]  HISTORY OF PRESENT ILLNESS: Larry Parrish reports having a right inguinal hernia since 10 days ago.  He reported that he was blowing some leaves and he felt something popping out in his right groin.  He reports that the pain radiates to the right testicle.  Aggravating factor is walking and doing certain activities.  Elevating factor is when the hernia is reduced.  Denies abdominal distention nausea or vomiting.  Denies fever or chills.   PROBLEM LIST: Hepatitis C Right inguinal hernia  GENERAL REVIEW OF SYSTEMS:   General ROS: negative for - chills, fatigue, fever, weight gain or weight loss Allergy and Immunology ROS: negative for - hives  Hematological and Lymphatic ROS: negative for - bleeding problems or bruising, negative for palpable nodes Endocrine ROS: negative for - heat or cold intolerance, hair changes Respiratory ROS: negative for - cough, shortness of breath or wheezing Cardiovascular ROS: no chest pain or palpitations GI ROS: negative for nausea, vomiting, abdominal pain, diarrhea, constipation Musculoskeletal ROS: negative for - joint swelling or muscle pain Neurological ROS: negative for - confusion, syncope Dermatological ROS: negative for pruritus and rash Psychiatric: negative for anxiety, depression, difficulty sleeping and memory loss  MEDICATIONS: Current Medications        Current Outpatient Medications  Medication Sig Dispense Refill  . doxycycline (MONODOX) 100 MG capsule Take 1 capsule (100 mg total) by mouth 2 (two) times daily for 7 days 14 capsule 0   No current facility-administered medications for this visit.       ALLERGIES: Patient has no known allergies.  PAST MEDICAL HISTORY: Hepatitis C  PAST SURGICAL HISTORY: History reviewed.  Denies any surgical  history.  FAMILY HISTORY:      Family History  Problem Relation Age of Onset  . Breast cancer Mother   . High blood pressure (Hypertension) Mother   . Stroke Mother   . Breast cancer Sister      SOCIAL HISTORY: Social History          Socioeconomic History  . Marital status: Married    Spouse name: Not on file  . Number of children: Not on file  . Years of education: Not on file  . Highest education level: Not on file  Occupational History  . Not on file  Social Needs  . Financial resource strain: Not on file  . Food insecurity    Worry: Not on file    Inability: Not on file  . Transportation needs    Medical: Not on file    Non-medical: Not on file  Tobacco Use  . Smoking status: Current Every Day Smoker    Types: Cigarettes  . Smokeless tobacco: Never Used  Substance and Sexual Activity  . Alcohol use: Never    Frequency: Never  . Drug use: Not Currently    Types: Marijuana, Methamphetamines  . Sexual activity: Not on file  Other Topics Concern  . Not on file  Social History Narrative  . Not on file      PHYSICAL EXAM:    Vitals:   09/17/19 1443  BP: (!) 137/97  Pulse: 104   Body mass index is 25.73 kg/m. Weight: 88.5 kg (195 lb)   GENERAL: Alert, active, oriented x3  HEENT: Pupils equal reactive to light. Extraocular movements are intact. Sclera clear. Palpebral conjunctiva normal red  color.Pharynx clear.  NECK: Supple with no palpable mass and no adenopathy.  LUNGS: Sound clear with no rales rhonchi or wheezes.  HEART: Regular rhythm S1 and S2 without murmur.  ABDOMEN: Soft and depressible, nontender with no palpable mass, no hepatomegaly.  Right groin hernia, reducible, mild tender to reduction.  EXTREMITIES: Well-developed well-nourished symmetrical with no dependent edema.  NEUROLOGICAL: Awake alert oriented, facial expression symmetrical, moving all extremities.  REVIEW OF DATA: I have reviewed  the following data today: No visits with results within 3 Month(s) from this visit.  Latest known visit with results is:  No results found for any previous visit.     ASSESSMENT: Larry Parrish is a 43 y.o. male presenting for consultation for right inguinal hernia.    The patient presents with a symptomatic, reducible inguinal hernia. Patient was oriented about the diagnosis of inguinal hernia and its implication. The patient was oriented about the treatment alternatives (observation vs surgical repair). Due to patient symptoms, repair is recommended. Patient oriented about the surgical procedure, the use of mesh and its risk of complications such as: infection, bleeding, injury to vas deference, vasculature and testicle, injury to bowel or bladder, and chronic pain.  Non-recurrent unilateral inguinal hernia without obstruction or gangrene [K40.90]  PLAN: 1. Minimally invasive right inguinal hernia repair with mesh [id number] 2. CBC, CMP 3. Do not take aspirin 5 days before surgery 4. Take antibiotic therapy as prescribed 5. Contact us if has any question or concern.   Patient verbalized understanding, all questions were answered, and were agreeable with the plan outlined above.    Larry Pun, MD  Electronically signed by Larry Pun, MD

## 2019-09-17 NOTE — H&P (Signed)
PATIENT PROFILE: Larry Parrish is a 43 y.o. male who presents to the Clinic for evaluation of right inguinal hernia.  PCP:  [facility] [place]  HISTORY OF PRESENT ILLNESS: Larry Parrish reports having a right inguinal hernia since 10 days ago.  He reported that he was blowing some leaves and he felt something popping out in his right groin.  He reports that the pain radiates to the right testicle.  Aggravating factor is walking and doing certain activities.  Elevating factor is when the hernia is reduced.  Denies abdominal distention nausea or vomiting.  Denies fever or chills.   PROBLEM LIST: Hepatitis C Right inguinal hernia  GENERAL REVIEW OF SYSTEMS:   General ROS: negative for - chills, fatigue, fever, weight gain or weight loss Allergy and Immunology ROS: negative for - hives  Hematological and Lymphatic ROS: negative for - bleeding problems or bruising, negative for palpable nodes Endocrine ROS: negative for - heat or cold intolerance, hair changes Respiratory ROS: negative for - cough, shortness of breath or wheezing Cardiovascular ROS: no chest pain or palpitations GI ROS: negative for nausea, vomiting, abdominal pain, diarrhea, constipation Musculoskeletal ROS: negative for - joint swelling or muscle pain Neurological ROS: negative for - confusion, syncope Dermatological ROS: negative for pruritus and rash Psychiatric: negative for anxiety, depression, difficulty sleeping and memory loss  MEDICATIONS: Current Medications        Current Outpatient Medications  Medication Sig Dispense Refill  . doxycycline (MONODOX) 100 MG capsule Take 1 capsule (100 mg total) by mouth 2 (two) times daily for 7 days 14 capsule 0   No current facility-administered medications for this visit.       ALLERGIES: Patient has no known allergies.  PAST MEDICAL HISTORY: Hepatitis C  PAST SURGICAL HISTORY: History reviewed.  Denies any surgical  history.  FAMILY HISTORY:      Family History  Problem Relation Age of Onset  . Breast cancer Mother   . High blood pressure (Hypertension) Mother   . Stroke Mother   . Breast cancer Sister      SOCIAL HISTORY: Social History          Socioeconomic History  . Marital status: Married    Spouse name: Not on file  . Number of children: Not on file  . Years of education: Not on file  . Highest education level: Not on file  Occupational History  . Not on file  Social Needs  . Financial resource strain: Not on file  . Food insecurity    Worry: Not on file    Inability: Not on file  . Transportation needs    Medical: Not on file    Non-medical: Not on file  Tobacco Use  . Smoking status: Current Every Day Smoker    Types: Cigarettes  . Smokeless tobacco: Never Used  Substance and Sexual Activity  . Alcohol use: Never    Frequency: Never  . Drug use: Not Currently    Types: Marijuana, Methamphetamines  . Sexual activity: Not on file  Other Topics Concern  . Not on file  Social History Narrative  . Not on file      PHYSICAL EXAM:    Vitals:   09/17/19 1443  BP: (!) 137/97  Pulse: 104   Body mass index is 25.73 kg/m. Weight: 88.5 kg (195 lb)   GENERAL: Alert, active, oriented x3  HEENT: Pupils equal reactive to light. Extraocular movements are intact. Sclera clear. Palpebral conjunctiva normal red  color.Pharynx clear.  NECK: Supple with no palpable mass and no adenopathy.  LUNGS: Sound clear with no rales rhonchi or wheezes.  HEART: Regular rhythm S1 and S2 without murmur.  ABDOMEN: Soft and depressible, nontender with no palpable mass, no hepatomegaly.  Right groin hernia, reducible, mild tender to reduction.  EXTREMITIES: Well-developed well-nourished symmetrical with no dependent edema.  NEUROLOGICAL: Awake alert oriented, facial expression symmetrical, moving all extremities.  REVIEW OF DATA: I have reviewed  the following data today: No visits with results within 3 Month(s) from this visit.  Latest known visit with results is:  No results found for any previous visit.     ASSESSMENT: Larry Parrish is a 43 y.o. male presenting for consultation for right inguinal hernia.    The patient presents with a symptomatic, reducible inguinal hernia. Patient was oriented about the diagnosis of inguinal hernia and its implication. The patient was oriented about the treatment alternatives (observation vs surgical repair). Due to patient symptoms, repair is recommended. Patient oriented about the surgical procedure, the use of mesh and its risk of complications such as: infection, bleeding, injury to vas deference, vasculature and testicle, injury to bowel or bladder, and chronic pain.  Non-recurrent unilateral inguinal hernia without obstruction or gangrene [K40.90]  PLAN: 1. Minimally invasive right inguinal hernia repair with mesh [id number] 2. CBC, CMP 3. Do not take aspirin 5 days before surgery 4. Take antibiotic therapy as prescribed 5. Contact us if has any question or concern.   Patient verbalized understanding, all questions were answered, and were agreeable with the plan outlined above.    Herbert Pun, MD  Electronically signed by Herbert Pun, MD

## 2019-09-22 ENCOUNTER — Inpatient Hospital Stay
Admission: RE | Admit: 2019-09-22 | Discharge: 2019-09-22 | Disposition: A | Discharge status: Home / Self Care | Payer: Medicaid Other | Source: Ambulatory Visit | Attending: General Surgery | Admitting: General Surgery

## 2019-09-22 ENCOUNTER — Other Ambulatory Visit
Admission: RE | Admit: 2019-09-22 | Discharge: 2019-09-22 | Disposition: A | Discharge status: Home / Self Care | Payer: Medicaid Other | Source: Ambulatory Visit | Attending: General Surgery | Admitting: General Surgery

## 2019-09-22 ENCOUNTER — Other Ambulatory Visit: Payer: Self-pay

## 2019-09-22 DIAGNOSIS — Z20828 Contact with and (suspected) exposure to other viral communicable diseases: Secondary | ICD-10-CM | POA: Insufficient documentation

## 2019-09-22 DIAGNOSIS — Z01812 Encounter for preprocedural laboratory examination: Secondary | ICD-10-CM | POA: Diagnosis present

## 2019-09-22 HISTORY — DX: Personal history of urinary calculi: Z87.442

## 2019-09-22 HISTORY — DX: Essential (primary) hypertension: I10

## 2019-09-22 LAB — SARS CORONAVIRUS 2 (TAT 6-24 HRS): SARS Coronavirus 2: NEGATIVE

## 2019-09-22 NOTE — Pre-Procedure Instructions (Signed)
EKG  Interpreted by me Sinus rhythm rate of 92, normal axis intervals QRS ST segments and T waves  ____________________________________________    RADIOLOGY  US Venous Img Lower Unilateral Left  Result Date: 04/01/2019 CLINICAL DATA:  Left lower extremity pain and swelling for 1 day EXAM: LEFT LOWER EXTREMITY VENOUS DUPLEX ULTRASOUND TECHNIQUE: Doppler venous assessment of the left lower extremity deep venous system was performed, including characterization of spectral flow, compressibility, and phasicity. COMPARISON:  01/21/2011 FINDINGS: There is complete compressibility of the left common femoral, femoral, and popliteal veins. Doppler analysis demonstrates respiratory phasicity and augmentation of flow with calf compression. No obvious superficial vein or calf vein thrombosis. IMPRESSION: No evidence of left lower extremity DVT. Electronically Signed   By: Marybelle Killings M.D.   On: 04/01/2019 10:57   Dg Chest Portable 1 View  Result Date: 04/01/2019 CLINICAL DATA:  Possible snake bite with foot swelling and cough EXAM: PORTABLE CHEST 1 VIEW COMPARISON:  08/21/2016 FINDINGS: Cardiac shadows within normal limits. The lungs are well aerated bilaterally. Old rib fractures are seen on the right. No focal infiltrate or sizable effusion is noted. No acute bony abnormality is seen. IMPRESSION: No acute abnormality noted. Electronically Signed   By: Inez Catalina M.D.   On: 04/01/2019 09:48    ____________________________________________   PROCEDURES Procedures  ____________________________________________  DIFFERENTIAL DIAGNOSIS  Snakebite envenomation, DVT, cellulitis.  Doubt osteomyelitis or necrotizing fasciitis.  Doubt septic arthritis.  Unlikely gout.  CLINICAL IMPRESSION / ASSESSMENT AND PLAN / ED COURSE  Medications ordered in the ED: Medications  sodium chloride 0.9 % bolus 1,000 mL (0 mLs Intravenous Stopped 04/01/19 1111)  fentaNYL (SUBLIMAZE) injection 100 mcg  (100 mcg Intravenous Given 04/01/19 0924)  sodium chloride 0.9 % bolus 1,000 mL (1,000 mLs Intravenous New Bag/Given 04/01/19 1427)    Pertinent labs & imaging results that were available during my care of the patient were reviewed by me and considered in my medical decision making (see chart for details).  Larry Parrish was evaluated in Emergency Department on 04/01/2019 for the symptoms described in the history of present illness. He was evaluated in the context of the global COVID-19 pandemic, which necessitated consideration that the patient might be at risk for infection with the SARS-CoV-2 virus that causes COVID-19. Institutional protocols and algorithms that pertain to the evaluation of patients at risk for COVID-19 are in a state of rapid change based on information released by regulatory bodies including the CDC and federal and state organizations. These policies and algorithms were followed during the patient's care in the ED.  Patient presents with left leg pain and swelling.  Check lab panel to evaluate for possible coagulopathy from envenomation.  Also possibly DVT.  Presentation not currently consistent with compartment syndrome.  His mom works in [facility] which is currently having a COVID-19 outbreak, but he states that she is careful with decontamination when she comes home, and he denies any body aches fevers chest pain or shortness of breath.  Doubt PE, ACS dissection AAA pneumothorax pericarditis.  Will check an ultrasound of the left lower extremity.     Clinical Course as of Apr 01 1531  Tue Apr 01, 2019  1257 Patient reports he has had 2 tetanus immunizations in the past 18 months.Labs significant for elevated fibrinogen, CK, and d-dimer.  Doubt PE AAA or dissection.  Ultrasound negative for DVT.  On reassessment, leg is not continuing to become more erythematous or swollen.  Compartments remain soft with  good distal perfusion and warmth and sensation.  No signs of  compartment syndrome at this time.  Will contact poison control regarding possible snakebite 24 hours ago with these lab abnormalities.  May also be related to cellulitis and acute phase reaction.  I will plan to start him on antibiotics and refer for outpatient follow-up unless poison control recommends continued observation and management of potential snakebite.  The patient notes that he has experience with cottonmouth and copper heads that he has kept as Pets and captured on his property in the past, and the 3 snakes that he found in his yard yesterday after the onset of symptoms do not match the appearance of copperhead or cottonmouth.   [PS]    Clinical Course User Index [PS] Carrie Mew, MD    ----------------------------------------- 3:32 PM on 04/01/2019 -----------------------------------------  Care of patient signed out to Dr. Kerman Passey to follow-up on poison control recommendations.   ____________________________________________   FINAL CLINICAL IMPRESSION(S) / ED DIAGNOSES    Final diagnoses:  Left leg cellulitis  Left leg swelling  Snake bite, initial encounter        ED Discharge Orders               Ordered     sulfamethoxazole-trimethoprim (BACTRIM DS) 800-160 MG tablet  2 times daily     04/01/19 1531     cephALEXin (KEFLEX) 500 MG capsule  3 times daily     04/01/19 1531     naproxen (NAPROSYN) 500 MG tablet  2 times daily with meals     04/01/19 1531           Portions of this note were generated with dragon dictation software. Dictation errors may occur despite best attempts at proofreading.   Carrie Mew, MD 04/01/19 [phone]         Electronically signed by Carrie Mew, MD at 04/01/2019 3:32 PM   ED to Hosp-Admission (Discharged) on 04/01/2019     Detailed Report

## 2019-09-23 ENCOUNTER — Encounter
Admission: RE | Admit: 2019-09-23 | Discharge: 2019-09-23 | Disposition: A | Discharge status: Home / Self Care | Payer: Medicaid Other | Source: Ambulatory Visit | Attending: General Surgery | Admitting: General Surgery

## 2019-09-23 ENCOUNTER — Other Ambulatory Visit: Payer: Self-pay

## 2019-09-23 NOTE — Patient Instructions (Signed)
Your procedure is scheduled on: Thurs 11/12 Report to Day Surgery. To find out your arrival time please call [phone] between 1PM - 3PM on Wed. 11/11.  Remember: Instructions that are not followed completely may result in serious medical risk,  up to and including death, or upon the discretion of your surgeon and anesthesiologist your  surgery may need to be rescheduled.     _X__ 1. Do not eat food after midnight the night before your procedure.                 No gum chewing or hard candies. You may drink clear liquids up to 2 hours                 before you are scheduled to arrive for your surgery- DO not drink clear                 liquids within 2 hours of the start of your surgery.                 Clear Liquids include:  water, apple juice without pulp, clear carbohydrate                 drink such as Clearfast of Gatorade, Black Coffee or Tea (Do not add                 anything to coffee or tea).  __X__2.  On the morning of surgery brush your teeth with toothpaste and water, you                may rinse your mouth with mouthwash if you wish.  Do not swallow any toothpaste of mouthwash.     _X__ 3.  No Alcohol for 24 hours before or after surgery.   _X__ 4.  Do Not Smoke or use e-cigarettes For 24 Hours Prior to Your Surgery.                 Do not use any chewable tobacco products for at least 6 hours prior to                 surgery.  ____  5.  Bring all medications with you on the day of surgery if instructed.   __x__  6.  Notify your doctor if there is any change in your medical condition      (cold, fever, infections).     Do not wear jewelry, make-up, hairpins, clips or nail polish. Do not wear lotions, powders, or perfumes. You may wear deodorant. Do not shave 48 hours prior to surgery. Men may shave face and neck. Do not bring valuables to the hospital.    [facility] is not responsible for any belongings or valuables.  Contacts,  dentures or bridgework may not be worn into surgery. Leave your suitcase in the car. After surgery it may be brought to your room. For patients admitted to the hospital, discharge time is determined by your treatment team.   Patients discharged the day of surgery will not be allowed to drive home.   Please read over the following fact sheets that you were given:    ____ Take these medicines the morning of surgery with A SIP OF WATER:    1. none  2.   3.   4.  5.  6.  ____ Fleet Enema (as directed)   ____ Use CHG Soap as directed  ____ Use inhalers on the day of  surgery  ____ Stop metformin 2 days prior to surgery    ____ Take 1/2 of usual insulin dose the night before surgery. No insulin the morning          of surgery.   ____ Stop Coumadin/Plavix/aspirin on   ____ Stop Anti-inflammatories on   ____ Stop supplements until after surgery.    ____ Bring C-Pap to the hospital.

## 2019-09-25 ENCOUNTER — Other Ambulatory Visit: Payer: Self-pay

## 2019-09-25 ENCOUNTER — Encounter: Payer: Self-pay | Admitting: Certified Registered"

## 2019-09-25 ENCOUNTER — Ambulatory Visit
Admission: RE | Admit: 2019-09-25 | Discharge: 2019-09-25 | Disposition: A | Discharge status: Home / Self Care | Payer: Medicaid Other | Attending: General Surgery | Admitting: General Surgery

## 2019-09-25 ENCOUNTER — Encounter
Admission: RE | Disposition: A | Discharge status: Home / Self Care | Payer: Self-pay | Source: Home / Self Care | Attending: General Surgery

## 2019-09-25 DIAGNOSIS — Z5309 Procedure and treatment not carried out because of other contraindication: Secondary | ICD-10-CM | POA: Insufficient documentation

## 2019-09-25 DIAGNOSIS — K4090 Unilateral inguinal hernia, without obstruction or gangrene, not specified as recurrent: Secondary | ICD-10-CM | POA: Insufficient documentation

## 2019-09-25 LAB — URINE DRUG SCREEN, QUALITATIVE (ARMC ONLY)
Amphetamines, Ur Screen: POSITIVE — AB
Barbiturates, Ur Screen: NOT DETECTED
Benzodiazepine, Ur Scrn: POSITIVE — AB
Cannabinoid 50 Ng, Ur ~~LOC~~: NOT DETECTED
Cocaine Metabolite,Ur ~~LOC~~: NOT DETECTED
MDMA (Ecstasy)Ur Screen: NOT DETECTED
Methadone Scn, Ur: NOT DETECTED
Opiate, Ur Screen: NOT DETECTED
Phencyclidine (PCP) Ur S: NOT DETECTED
Tricyclic, Ur Screen: NOT DETECTED

## 2019-09-25 SURGERY — REPAIR, HERNIA, INGUINAL, ROBOT-ASSISTED, LAPAROSCOPIC, USING MESH
Anesthesia: General | Site: Groin | Laterality: Right

## 2019-09-25 MED ORDER — EPINEPHRINE PF 1 MG/ML IJ SOLN
INTRAMUSCULAR | Status: AC
  Filled 2019-09-25: qty 1

## 2019-09-25 MED ORDER — MIDAZOLAM HCL 2 MG/2ML IJ SOLN
INTRAMUSCULAR | Status: AC
  Filled 2019-09-25: qty 2

## 2019-09-25 MED ORDER — FAMOTIDINE 20 MG PO TABS
20.0000 mg | ORAL_TABLET | Freq: Once | ORAL | Status: AC
  Administered 2019-09-25: 12:00:00 20 mg via ORAL

## 2019-09-25 MED ORDER — LACTATED RINGERS IV SOLN: INTRAVENOUS | Status: DC

## 2019-09-25 MED ORDER — CEFAZOLIN SODIUM-DEXTROSE 2-4 GM/100ML-% IV SOLN
INTRAVENOUS | Status: AC
  Filled 2019-09-25: qty 100

## 2019-09-25 MED ORDER — CEFAZOLIN SODIUM-DEXTROSE 2-4 GM/100ML-% IV SOLN: 2.0000 g | INTRAVENOUS | Status: DC

## 2019-09-25 MED ORDER — BUPIVACAINE HCL (PF) 0.25 % IJ SOLN
INTRAMUSCULAR | Status: AC
  Filled 2019-09-25: qty 30

## 2019-09-25 MED ORDER — FENTANYL CITRATE (PF) 100 MCG/2ML IJ SOLN
INTRAMUSCULAR | Status: AC
  Filled 2019-09-25: qty 2

## 2019-09-25 MED ORDER — FAMOTIDINE 20 MG PO TABS
ORAL_TABLET | ORAL | Status: AC
  Administered 2019-09-25: 20 mg via ORAL
  Filled 2019-09-25: qty 1

## 2019-09-25 MED ORDER — ENOXAPARIN SODIUM 40 MG/0.4ML ~~LOC~~ SOLN
40.0000 mg | Freq: Once | SUBCUTANEOUS | Status: DC
  Filled 2019-09-25 (×2): qty 0.4

## 2019-09-25 SURGICAL SUPPLY — 48 items
BAG INFUSER PRESSURE 100CC (MISCELLANEOUS) IMPLANT
BLADE SURG SZ11 CARB STEEL (BLADE) ×3 IMPLANT
CANISTER SUCT 1200ML W/VALVE (MISCELLANEOUS) ×3 IMPLANT
CHLORAPREP W/TINT 26 (MISCELLANEOUS) ×3 IMPLANT
COVER TIP SHEARS 8 DVNC (MISCELLANEOUS) ×1 IMPLANT
COVER TIP SHEARS 8MM DA VINCI (MISCELLANEOUS) ×2
COVER WAND RF STERILE (DRAPES) ×6 IMPLANT
DEFOGGER SCOPE WARMER CLEARIFY (MISCELLANEOUS) ×3 IMPLANT
DERMABOND ADVANCED (GAUZE/BANDAGES/DRESSINGS) ×2
DERMABOND ADVANCED .7 DNX12 (GAUZE/BANDAGES/DRESSINGS) ×1 IMPLANT
DRAPE 3/4 80X56 (DRAPES) ×3 IMPLANT
DRAPE ARM DVNC X/XI (DISPOSABLE) ×4 IMPLANT
DRAPE COLUMN DVNC XI (DISPOSABLE) ×1 IMPLANT
DRAPE DA VINCI XI ARM (DISPOSABLE) ×8
DRAPE DA VINCI XI COLUMN (DISPOSABLE) ×2
ELECT CAUTERY BLADE 6.4 (BLADE) IMPLANT
ELECT REM PT RETURN 9FT ADLT (ELECTROSURGICAL) ×3
ELECTRODE REM PT RTRN 9FT ADLT (ELECTROSURGICAL) ×1 IMPLANT
GLOVE BIO SURGEON STRL SZ 6.5 (GLOVE) ×4 IMPLANT
GLOVE BIO SURGEONS STRL SZ 6.5 (GLOVE) ×2
GLOVE BIOGEL PI IND STRL 6.5 (GLOVE) ×2 IMPLANT
GLOVE BIOGEL PI INDICATOR 6.5 (GLOVE) ×4
GOWN STRL REUS W/ TWL LRG LVL3 (GOWN DISPOSABLE) ×3 IMPLANT
GOWN STRL REUS W/TWL LRG LVL3 (GOWN DISPOSABLE) ×6
IRRIGATOR SUCT 8 DISP DVNC XI (IRRIGATION / IRRIGATOR) IMPLANT
IRRIGATOR SUCTION 8MM XI DISP (IRRIGATION / IRRIGATOR)
IV NS 1000ML (IV SOLUTION)
IV NS 1000ML BAXH (IV SOLUTION) IMPLANT
KIT PINK PAD W/HEAD ARE REST (MISCELLANEOUS) ×3
KIT PINK PAD W/HEAD ARM REST (MISCELLANEOUS) ×1 IMPLANT
LABEL OR SOLS (LABEL) IMPLANT
NEEDLE HYPO 22GX1.5 SAFETY (NEEDLE) ×3 IMPLANT
NEEDLE VERESS 14GA 120MM (NEEDLE) ×3 IMPLANT
OBTURATOR OPTICAL STANDARD 8MM (TROCAR) ×2
OBTURATOR OPTICAL STND 8 DVNC (TROCAR) ×1
OBTURATOR OPTICALSTD 8 DVNC (TROCAR) ×1 IMPLANT
PACK LAP CHOLECYSTECTOMY (MISCELLANEOUS) ×3 IMPLANT
PENCIL ELECTRO HAND CTR (MISCELLANEOUS) IMPLANT
SEAL CANN UNIV 5-8 DVNC XI (MISCELLANEOUS) ×3 IMPLANT
SEAL XI 5MM-8MM UNIVERSAL (MISCELLANEOUS) ×6
SOLUTION ELECTROLUBE (MISCELLANEOUS) ×3 IMPLANT
SUT DVC VLOC 3-0 CL 6 P-12 (SUTURE) ×6 IMPLANT
SUT MNCRL AB 4-0 PS2 18 (SUTURE) ×3 IMPLANT
SUT VIC AB 2-0 SH 27 (SUTURE) ×2
SUT VIC AB 2-0 SH 27XBRD (SUTURE) ×1 IMPLANT
SUT VLOC 90 S/L VL9 GS22 (SUTURE) ×6 IMPLANT
TRAY FOLEY MTR SLVR 16FR STAT (SET/KITS/TRAYS/PACK) ×3 IMPLANT
TUBING EVAC SMOKE HEATED PNEUM (TUBING) ×3 IMPLANT

## 2019-09-25 NOTE — Progress Notes (Signed)
Patient cancelled due to positive UDS.

## 2019-09-25 NOTE — Anesthesia Preprocedure Evaluation (Addendum)
Anesthesia Evaluation  Patient identified by MRN, date of birth, ID band Patient awake    Reviewed: Allergy & Precautions, H&P , NPO status , reviewed documented beta blocker date and time   Airway Mallampati: II  TM Distance: >3 FB Neck ROM: full    Dental  (+) Chipped, Missing, Poor Dentition   Pulmonary Current Smoker and Patient abstained from smoking.,    Pulmonary exam normal        Cardiovascular hypertension, Normal cardiovascular exam     Neuro/Psych PSYCHIATRIC DISORDERS States no cocaine/amphetamine use > 1 week. UDS pending   GI/Hepatic neg GERD  ,(+) Hepatitis -, C  Endo/Other    Renal/GU      Musculoskeletal   Abdominal   Peds  Hematology   Anesthesia Other Findings Past Medical History: No date: GI bleed No date: Hepatitis C     Comment:  treated No date: History of kidney stones No date: History of pancreatitis No date: Hypertension     Comment:  no meds for 3 years Past Surgical History: 10/21/2015: ESOPHAGOGASTRODUODENOSCOPY (EGD) WITH PROPOFOL; N/A     Comment:  Procedure: ESOPHAGOGASTRODUODENOSCOPY (EGD) WITH               PROPOFOL;  Surgeon: Lucilla Lame, MD;  Location: [place];  Service: Endoscopy;  Laterality: N/A; 2008: HAND SURGERY; Left 10/08/2015: I&D EXTREMITY; Left     Comment:  Arm No date: TOOTH EXTRACTION BMI    Body Mass Index: 25.73 kg/m   ____Case cancelled 2 to pos amphetamine UDS, surgeon's office to reschedule___  Reproductive/Obstetrics                            Anesthesia Physical Anesthesia Plan  ASA: III  Anesthesia Plan: General   Post-op Pain Management:    Induction: Intravenous  PONV Risk Score and Plan: 2 and Ondansetron, Midazolam, Dexamethasone and Treatment may vary due to age or medical condition  Airway Management Planned: Oral ETT  Additional Equipment:   Intra-op Plan:   Post-operative  Plan: Extubation in OR  Informed Consent: I have reviewed the patients History and Physical, chart, labs and discussed the procedure including the risks, benefits and alternatives for the proposed anesthesia with the patient or authorized representative who has indicated his/her understanding and acceptance.     Dental Advisory Given  Plan Discussed with: CRNA  Anesthesia Plan Comments:         Anesthesia Quick Evaluation

## 2019-09-25 NOTE — Progress Notes (Signed)
Pt positive for Benzodiazepines and amphetamines via urine drug screen. Dr. Lavone Neri notified.

## 2019-10-10 ENCOUNTER — Other Ambulatory Visit
Admission: RE | Admit: 2019-10-10 | Discharge: 2019-10-10 | Disposition: A | Discharge status: Home / Self Care | Payer: Medicaid Other | Source: Ambulatory Visit | Attending: General Surgery | Admitting: General Surgery

## 2019-10-10 ENCOUNTER — Other Ambulatory Visit: Payer: Self-pay

## 2019-10-10 DIAGNOSIS — Z20828 Contact with and (suspected) exposure to other viral communicable diseases: Secondary | ICD-10-CM | POA: Insufficient documentation

## 2019-10-10 DIAGNOSIS — Z01812 Encounter for preprocedural laboratory examination: Secondary | ICD-10-CM | POA: Diagnosis present

## 2019-10-11 LAB — SARS CORONAVIRUS 2 (TAT 6-24 HRS): SARS Coronavirus 2: NEGATIVE

## 2019-10-13 ENCOUNTER — Ambulatory Visit: Payer: Self-pay | Admitting: General Surgery

## 2019-10-15 ENCOUNTER — Ambulatory Visit: Payer: Self-pay | Admitting: Certified Registered Nurse Anesthetist

## 2019-10-15 ENCOUNTER — Other Ambulatory Visit: Payer: Self-pay

## 2019-10-15 ENCOUNTER — Encounter: Payer: Self-pay | Admitting: *Deleted

## 2019-10-15 ENCOUNTER — Encounter
Admission: RE | Disposition: A | Discharge status: Home / Self Care | Payer: Self-pay | Source: Home / Self Care | Attending: General Surgery

## 2019-10-15 ENCOUNTER — Ambulatory Visit
Admission: RE | Admit: 2019-10-15 | Discharge: 2019-10-15 | Disposition: A | Discharge status: Home / Self Care | Payer: Self-pay | Attending: General Surgery | Admitting: General Surgery

## 2019-10-15 DIAGNOSIS — F17210 Nicotine dependence, cigarettes, uncomplicated: Secondary | ICD-10-CM | POA: Insufficient documentation

## 2019-10-15 DIAGNOSIS — I10 Essential (primary) hypertension: Secondary | ICD-10-CM | POA: Insufficient documentation

## 2019-10-15 DIAGNOSIS — D176 Benign lipomatous neoplasm of spermatic cord: Secondary | ICD-10-CM | POA: Insufficient documentation

## 2019-10-15 DIAGNOSIS — K4020 Bilateral inguinal hernia, without obstruction or gangrene, not specified as recurrent: Secondary | ICD-10-CM | POA: Insufficient documentation

## 2019-10-15 HISTORY — PX: XI ROBOTIC ASSISTED INGUINAL HERNIA REPAIR WITH MESH: SHX6706

## 2019-10-15 LAB — URINE DRUG SCREEN, QUALITATIVE (ARMC ONLY)
Amphetamines, Ur Screen: NOT DETECTED
Barbiturates, Ur Screen: NOT DETECTED
Benzodiazepine, Ur Scrn: NOT DETECTED
Cannabinoid 50 Ng, Ur ~~LOC~~: NOT DETECTED
Cocaine Metabolite,Ur ~~LOC~~: NOT DETECTED
MDMA (Ecstasy)Ur Screen: NOT DETECTED
Methadone Scn, Ur: NOT DETECTED
Opiate, Ur Screen: NOT DETECTED
Phencyclidine (PCP) Ur S: NOT DETECTED
Tricyclic, Ur Screen: NOT DETECTED

## 2019-10-15 SURGERY — REPAIR, HERNIA, INGUINAL, ROBOT-ASSISTED, LAPAROSCOPIC, USING MESH
Anesthesia: General | Site: Groin | Laterality: Bilateral

## 2019-10-15 MED ORDER — DEXAMETHASONE SODIUM PHOSPHATE 10 MG/ML IJ SOLN
INTRAMUSCULAR | Status: AC
  Filled 2019-10-15: qty 1

## 2019-10-15 MED ORDER — FENTANYL CITRATE (PF) 100 MCG/2ML IJ SOLN
INTRAMUSCULAR | Status: AC
  Filled 2019-10-15: qty 2

## 2019-10-15 MED ORDER — ONDANSETRON HCL 4 MG/2ML IJ SOLN: 4.0000 mg | Freq: Once | INTRAMUSCULAR | Status: DC | PRN

## 2019-10-15 MED ORDER — FAMOTIDINE 20 MG PO TABS
ORAL_TABLET | ORAL | Status: AC
  Administered 2019-10-15: 20 mg via ORAL
  Filled 2019-10-15: qty 1

## 2019-10-15 MED ORDER — BUPIVACAINE-EPINEPHRINE 0.25% -1:200000 IJ SOLN
INTRAMUSCULAR | Status: DC | PRN
  Administered 2019-10-15: 15 mL

## 2019-10-15 MED ORDER — ONDANSETRON HCL 4 MG/2ML IJ SOLN
INTRAMUSCULAR | Status: DC | PRN
  Administered 2019-10-15: 4 mg via INTRAVENOUS

## 2019-10-15 MED ORDER — LIDOCAINE HCL (PF) 2 % IJ SOLN
INTRAMUSCULAR | Status: AC
  Filled 2019-10-15: qty 10

## 2019-10-15 MED ORDER — LIDOCAINE HCL (CARDIAC) PF 100 MG/5ML IV SOSY
PREFILLED_SYRINGE | INTRAVENOUS | Status: DC | PRN
  Administered 2019-10-15: 100 mg via INTRAVENOUS

## 2019-10-15 MED ORDER — PROPOFOL 10 MG/ML IV BOLUS
INTRAVENOUS | Status: DC | PRN
  Administered 2019-10-15: 200 mg via INTRAVENOUS

## 2019-10-15 MED ORDER — CEFAZOLIN SODIUM-DEXTROSE 2-4 GM/100ML-% IV SOLN
2.0000 g | INTRAVENOUS | Status: AC
  Administered 2019-10-15: 2 g via INTRAVENOUS

## 2019-10-15 MED ORDER — SUGAMMADEX SODIUM 200 MG/2ML IV SOLN
INTRAVENOUS | Status: DC | PRN
  Administered 2019-10-15: 200 mg via INTRAVENOUS

## 2019-10-15 MED ORDER — KETOROLAC TROMETHAMINE 30 MG/ML IJ SOLN
INTRAMUSCULAR | Status: DC | PRN
  Administered 2019-10-15: 30 mg via INTRAVENOUS

## 2019-10-15 MED ORDER — DEXAMETHASONE SODIUM PHOSPHATE 10 MG/ML IJ SOLN
INTRAMUSCULAR | Status: DC | PRN
  Administered 2019-10-15: 10 mg via INTRAVENOUS

## 2019-10-15 MED ORDER — ACETAMINOPHEN 10 MG/ML IV SOLN
INTRAVENOUS | Status: AC
  Filled 2019-10-15: qty 100

## 2019-10-15 MED ORDER — ROCURONIUM BROMIDE 100 MG/10ML IV SOLN
INTRAVENOUS | Status: DC | PRN
  Administered 2019-10-15: 20 mg via INTRAVENOUS
  Administered 2019-10-15: 50 mg via INTRAVENOUS
  Administered 2019-10-15 (×3): 10 mg via INTRAVENOUS

## 2019-10-15 MED ORDER — PHENYLEPHRINE HCL (PRESSORS) 10 MG/ML IV SOLN
INTRAVENOUS | Status: DC | PRN
  Administered 2019-10-15 (×2): 100 ug via INTRAVENOUS

## 2019-10-15 MED ORDER — ROCURONIUM BROMIDE 50 MG/5ML IV SOLN
INTRAVENOUS | Status: AC
  Filled 2019-10-15: qty 1

## 2019-10-15 MED ORDER — CEFAZOLIN SODIUM-DEXTROSE 2-4 GM/100ML-% IV SOLN
INTRAVENOUS | Status: AC
  Filled 2019-10-15: qty 100

## 2019-10-15 MED ORDER — LACTATED RINGERS IV SOLN
INTRAVENOUS | Status: DC
  Administered 2019-10-15: 10:00:00 via INTRAVENOUS

## 2019-10-15 MED ORDER — HYDROCODONE-ACETAMINOPHEN 5-325 MG PO TABS
ORAL_TABLET | ORAL | Status: AC
  Filled 2019-10-15: qty 1

## 2019-10-15 MED ORDER — HYDROCODONE-ACETAMINOPHEN 5-325 MG PO TABS
1.0000 | ORAL_TABLET | ORAL | Status: DC | PRN
  Administered 2019-10-15: 1 via ORAL

## 2019-10-15 MED ORDER — BUPIVACAINE-EPINEPHRINE (PF) 0.25% -1:200000 IJ SOLN
INTRAMUSCULAR | Status: AC
  Filled 2019-10-15: qty 30

## 2019-10-15 MED ORDER — PROPOFOL 10 MG/ML IV BOLUS
INTRAVENOUS | Status: AC
  Filled 2019-10-15: qty 20

## 2019-10-15 MED ORDER — FENTANYL CITRATE (PF) 100 MCG/2ML IJ SOLN
25.0000 ug | INTRAMUSCULAR | Status: DC | PRN
  Administered 2019-10-15 (×4): 25 ug via INTRAVENOUS

## 2019-10-15 MED ORDER — HYDROCODONE-ACETAMINOPHEN 5-325 MG PO TABS: 1.0000 | ORAL_TABLET | ORAL | 0 refills | Status: AC | PRN

## 2019-10-15 MED ORDER — MIDAZOLAM HCL 2 MG/2ML IJ SOLN
INTRAMUSCULAR | Status: DC | PRN
  Administered 2019-10-15: 2 mg via INTRAVENOUS

## 2019-10-15 MED ORDER — FAMOTIDINE 20 MG PO TABS
20.0000 mg | ORAL_TABLET | Freq: Once | ORAL | Status: AC
  Administered 2019-10-15: 10:00:00 20 mg via ORAL

## 2019-10-15 MED ORDER — ACETAMINOPHEN 10 MG/ML IV SOLN
INTRAVENOUS | Status: DC | PRN
  Administered 2019-10-15: 1000 mg via INTRAVENOUS

## 2019-10-15 MED ORDER — MIDAZOLAM HCL 2 MG/2ML IJ SOLN
INTRAMUSCULAR | Status: AC
  Filled 2019-10-15: qty 2

## 2019-10-15 MED ORDER — ONDANSETRON HCL 4 MG/2ML IJ SOLN
INTRAMUSCULAR | Status: AC
  Filled 2019-10-15: qty 2

## 2019-10-15 MED ORDER — FENTANYL CITRATE (PF) 100 MCG/2ML IJ SOLN
INTRAMUSCULAR | Status: DC | PRN
  Administered 2019-10-15: 25 ug via INTRAVENOUS
  Administered 2019-10-15: 50 ug via INTRAVENOUS
  Administered 2019-10-15: 25 ug via INTRAVENOUS

## 2019-10-15 SURGICAL SUPPLY — 51 items
BAG INFUSER PRESSURE 100CC (MISCELLANEOUS) IMPLANT
BLADE SURG SZ11 CARB STEEL (BLADE) ×2 IMPLANT
CANISTER SUCT 1200ML W/VALVE (MISCELLANEOUS) ×2 IMPLANT
CHLORAPREP W/TINT 26 (MISCELLANEOUS) ×2 IMPLANT
COVER TIP SHEARS 8 DVNC (MISCELLANEOUS) ×1 IMPLANT
COVER TIP SHEARS 8MM DA VINCI (MISCELLANEOUS) ×1
COVER WAND RF STERILE (DRAPES) ×4 IMPLANT
DEFOGGER SCOPE WARMER CLEARIFY (MISCELLANEOUS) ×2 IMPLANT
DERMABOND ADVANCED (GAUZE/BANDAGES/DRESSINGS) ×1
DERMABOND ADVANCED .7 DNX12 (GAUZE/BANDAGES/DRESSINGS) ×1 IMPLANT
DRAPE 3/4 80X56 (DRAPES) ×2 IMPLANT
DRAPE ARM DVNC X/XI (DISPOSABLE) ×3 IMPLANT
DRAPE COLUMN DVNC XI (DISPOSABLE) ×1 IMPLANT
DRAPE DA VINCI XI ARM (DISPOSABLE) ×3
DRAPE DA VINCI XI COLUMN (DISPOSABLE) ×1
ELECT CAUTERY BLADE 6.4 (BLADE) IMPLANT
ELECT REM PT RETURN 9FT ADLT (ELECTROSURGICAL) ×2
ELECTRODE REM PT RTRN 9FT ADLT (ELECTROSURGICAL) ×1 IMPLANT
GLOVE BIO SURGEON STRL SZ 6.5 (GLOVE) ×4 IMPLANT
GLOVE BIOGEL PI IND STRL 6.5 (GLOVE) ×2 IMPLANT
GLOVE BIOGEL PI INDICATOR 6.5 (GLOVE) ×2
GOWN STRL REUS W/ TWL LRG LVL3 (GOWN DISPOSABLE) ×3 IMPLANT
GOWN STRL REUS W/TWL LRG LVL3 (GOWN DISPOSABLE) ×3
IRRIGATOR SUCT 8 DISP DVNC XI (IRRIGATION / IRRIGATOR) IMPLANT
IRRIGATOR SUCTION 8MM XI DISP (IRRIGATION / IRRIGATOR)
IV NS 1000ML (IV SOLUTION)
IV NS 1000ML BAXH (IV SOLUTION) IMPLANT
KIT PINK PAD W/HEAD ARE REST (MISCELLANEOUS) ×2
KIT PINK PAD W/HEAD ARM REST (MISCELLANEOUS) ×1 IMPLANT
LABEL OR SOLS (LABEL) IMPLANT
MESH 3DMAX 4X6 LT LRG (Mesh General) ×1 IMPLANT
MESH 3DMAX 4X6 RT LRG (Mesh General) ×1 IMPLANT
MESH 3DMAX MID 4X6 LT LRG (Mesh General) IMPLANT
MESH 3DMAX MID 4X6 RT LRG (Mesh General) IMPLANT
NEEDLE HYPO 22GX1.5 SAFETY (NEEDLE) ×2 IMPLANT
NEEDLE VERESS 14GA 120MM (NEEDLE) ×2 IMPLANT
OBTURATOR OPTICAL STANDARD 8MM (TROCAR) ×1
OBTURATOR OPTICAL STND 8 DVNC (TROCAR) ×1
OBTURATOR OPTICALSTD 8 DVNC (TROCAR) ×1 IMPLANT
PACK LAP CHOLECYSTECTOMY (MISCELLANEOUS) ×2 IMPLANT
PENCIL ELECTRO HAND CTR (MISCELLANEOUS) IMPLANT
SEAL CANN UNIV 5-8 DVNC XI (MISCELLANEOUS) ×3 IMPLANT
SEAL XI 5MM-8MM UNIVERSAL (MISCELLANEOUS) ×3
SOLUTION ELECTROLUBE (MISCELLANEOUS) ×2 IMPLANT
SUT MNCRL AB 4-0 PS2 18 (SUTURE) ×2 IMPLANT
SUT VIC AB 2-0 SH 27 (SUTURE) ×2
SUT VIC AB 2-0 SH 27XBRD (SUTURE) ×1 IMPLANT
SUT VLOC 90 S/L VL9 GS22 (SUTURE) ×4 IMPLANT
TAPE TRANSPORE STRL 2 31045 (GAUZE/BANDAGES/DRESSINGS) ×2 IMPLANT
TRAY FOLEY MTR SLVR 16FR STAT (SET/KITS/TRAYS/PACK) ×2 IMPLANT
TUBING EVAC SMOKE HEATED PNEUM (TUBING) ×2 IMPLANT

## 2019-10-15 NOTE — Discharge Instructions (Addendum)
AMBULATORY SURGERY  DISCHARGE INSTRUCTIONS   1) The drugs that you were given will stay in your system until tomorrow so for the next 24 hours you should not:  A) Drive an automobile B) Make any legal decisions C) Drink any alcoholic beverage   2) You may resume regular meals tomorrow.  Today it is better to start with liquids and gradually work up to solid foods.  You may eat anything you prefer, but it is better to start with liquids, then soup and crackers, and gradually work up to solid foods.   3) Please notify your doctor immediately if you have any unusual bleeding, trouble breathing, redness and pain at the surgery site, drainage, fever, or pain not relieved by medication.    4) Additional Instructions:        Please contact your physician with any problems or Same Day Surgery at [phone], Monday through Friday 6 am to 4 pm, or [place] [facility] at [facility] number at [phone].AMBULATORY SURGERY  DISCHARGE INSTRUCTIONS   5) The drugs that you were given will stay in your system until tomorrow so for the next 24 hours you should not:  D) Drive an automobile E) Make any legal decisions F) Drink any alcoholic beverage   6) You may resume regular meals tomorrow.  Today it is better to start with liquids and gradually work up to solid foods.  You may eat anything you prefer, but it is better to start with liquids, then soup and crackers, and gradually work up to solid foods.   7) Please notify your doctor immediately if you have any unusual bleeding, trouble breathing, redness and pain at the surgery site, drainage, fever, or pain not relieved by medication.    8) Additional Instructions:        Please contact your physician with any problems or Same Day Surgery at [phone], Monday through Friday 6 am to 4 pm, or [place] [place] at [facility] number at [phone]. Diet: Resume home heart healthy regular diet.   Activity: No heavy lifting >20  pounds (children, pets, laundry, garbage) or strenuous activity until follow-up, but light activity and walking are encouraged. Do not drive or drink alcohol if taking narcotic pain medications.  Wound care: May shower with soapy water and pat dry (do not rub incisions), but no baths or submerging incision underwater until follow-up. (no swimming)   Medications: Resume all home medications. For mild to moderate pain: acetaminophen (Tylenol) or ibuprofen (if no kidney disease). Combining Tylenol with alcohol can substantially increase your risk of causing liver disease. Narcotic pain medications, if prescribed, can be used for severe pain, though may cause nausea, constipation, and drowsiness. Do not combine Tylenol and Norco within a 6 hour period as Norco contains Tylenol. If you do not need the narcotic pain medication, you do not need to fill the prescription.  Take 1000 mg of Tylenol every 8 hours for the next 2 days Take 800 mg of ibuprofen every 8 hours for the next 2 days Use Norco just in case of more severe pain.   Call office [phone]) at any time if any questions, worsening pain, fevers/chills, bleeding, drainage from incision site, or other concerns.

## 2019-10-15 NOTE — Anesthesia Procedure Notes (Signed)
Procedure Name: Intubation Date/Time: 10/15/2019 11:18 AM Performed by: Caryl Asp, CRNA Pre-anesthesia Checklist: Patient identified, Patient being monitored, Timeout performed, Emergency Drugs available and Suction available Patient Re-evaluated:Patient Re-evaluated prior to induction Oxygen Delivery Method: Circle system utilized Preoxygenation: Pre-oxygenation with 100% oxygen Induction Type: IV induction Ventilation: Mask ventilation without difficulty Laryngoscope Size: McGraph and 4 Grade View: Grade I Tube type: Oral Tube size: 7.0 mm Number of attempts: 1 Airway Equipment and Method: Stylet and Video-laryngoscopy Placement Confirmation: ETT inserted through vocal cords under direct vision,  positive ETCO2 and breath sounds checked- equal and bilateral Secured at: 22 cm Tube secured with: Tape Dental Injury: Teeth and Oropharynx as per pre-operative assessment

## 2019-10-15 NOTE — Op Note (Signed)
Preoperative diagnosis: Bilateral inguinal hernia.   Postoperative diagnosis: Bilateral inguinal hernia.  Procedure: Robotic assisted Laparoscopic Transabdominal preperitoneal laparoscopic (TAPP) repair of Bilateral inguinal hernia.  Anesthesia: GETA  Surgeon: Dr. Windell Moment  Wound Classification: Clean  Indications:  Patient is a 43 y.o. male developed a symptomatic bilateral inguinal hernia. Repair was indicated.  Findings: 1. Bilateral indirect inguinal hernia identified 2. Vas deferens and cord structures identified and preserved 3. Bard 3D Max mesh used for repair 4. Adequate hemostasis.   Description of procedure: The patient was taken to the operating room and the correct side of surgery was verified. The patient was placed supine with arms tucked at the sides. After obtaining adequate anesthesia, the patient's abdomen was prepped and draped in standard sterile fashion. The patient was placed in the Trendelenburg position. A time-out was completed verifying correct patient, procedure, site, positioning, and implant(s) and/or special equipment prior to beginning this procedure. A Veress needle was placed at the umbilicus and pneumoperitoneum created with insufflation of carbon dioxide to 15 mmHg. After the Veress needle was removed, an 8-mm trocar was placed on epigastric area and the 30 angled laparoscope inserted. Two 8-mm trocars were then placed lateral to the rectus sheath under direct visualization. Both inguinal regions were inspected and the median umbilical ligament, medial umbilical ligament, and lateral umbilical fold were identified.  The robotic arms were docked. The robotic scope was inserted and the pelvic area anatomy targeted.  The peritoneum of the right side was incised with scissors along a line 5 cm above the superior edge of the hernia defect, extending from the median umbilical ligament to the anterior superior iliac spine. The peritoneal flap was mobilized  inferiorly using blunt and sharp dissection. The inferior epigastric vessels were exposed and the pubic symphysis was identified. Cooper's ligament was dissected to its junction with the iliac vein. The dissection was continued inferiorly to the iliopubic tract, with care taken to avoid injury to the femoral branch of the genitofemoral nerve and the lateral femoral cutaneous nerve. The cord structures were parietalized. The hernia was identified and reduced by gentle traction.  The indirect hernia sac was noted mobilized from the cord structures and reduced into the peritoneal cavity.  A large piece of mesh was rolled longitudinally into a compact cylinder and passed through a trocar. The cylinder was placed along the inferior aspect of the working space and unrolled into place to completely cover the direct, indirect, and femoral spaces. The mesh was secured into place superiorly to the anterior abdominal wall and inferiorly and medially to Cooper's ligament with absorbable sutures. Care was taken to avoid the inferolateral triangles containing the iliac vessels and genital nerves. The peritoneal flap was closed over the mesh and secured with suture in similar positions of safety.  The left side was inspected and a small indirect inguinal hernia was identified. It was repaired in the same fashion and cord lipoma dissected.  After ensuring adequate hemostasis, the trocars were removed and the pneumoperitoneum allowed to escape. The trocar incisions were closed using monocryl and skin adhesive dressings applied.  The patient tolerated the procedure well and was taken to the postanesthesia care unit in stable condition.   Specimen: Left cord lipoma  Complications: None  Estimated Blood Loss: 5 mL

## 2019-10-15 NOTE — Transfer of Care (Signed)
Immediate Anesthesia Transfer of Care Note  Patient: Larry Parrish  Procedure(s) Performed: XI ROBOTIC ASSISTED INGUINAL HERNIA REPAIR WITH MESH (Bilateral Groin)  Patient Location: PACU  Anesthesia Type:General  Level of Consciousness: drowsy  Airway & Oxygen Therapy: Patient Spontanous Breathing and Patient connected to face mask oxygen  Post-op Assessment: Report given to RN and Post -op Vital signs reviewed and stable  Post vital signs: Reviewed and stable  Last Vitals:  Vitals Value Taken Time  BP 132/84 10/15/19 1411  Temp 36.3 C 10/15/19 1411  Pulse 89 10/15/19 1415  Resp 14 10/15/19 1415  SpO2 100 % 10/15/19 1415  Vitals shown include unvalidated device data.  Last Pain:  Vitals:   10/15/19 1411  TempSrc:   PainSc: Asleep         Complications: No apparent anesthesia complications

## 2019-10-15 NOTE — Anesthesia Post-op Follow-up Note (Signed)
Anesthesia QCDR form completed.        

## 2019-10-15 NOTE — Anesthesia Preprocedure Evaluation (Signed)
Anesthesia Evaluation  Patient identified by MRN, date of birth, ID band Patient awake    Reviewed: Allergy & Precautions, H&P , NPO status , reviewed documented beta blocker date and time   Airway Mallampati: II  TM Distance: >3 FB Neck ROM: full    Dental  (+) Chipped, Missing, Poor Dentition, Loose Left upper front:   Pulmonary Current Smoker and Patient abstained from smoking.,    Pulmonary exam normal        Cardiovascular hypertension, Normal cardiovascular exam     Neuro/Psych PSYCHIATRIC DISORDERS States no cocaine/amphetamine use > 1 week. UDS pending   GI/Hepatic neg GERD  ,(+) Hepatitis -, C  Endo/Other    Renal/GU      Musculoskeletal   Abdominal   Peds  Hematology   Anesthesia Other Findings Past Medical History: No date: GI bleed No date: Hepatitis C     Comment:  treated No date: History of kidney stones No date: History of pancreatitis No date: Hypertension     Comment:  no meds for 3 years Past Surgical History: 10/21/2015: ESOPHAGOGASTRODUODENOSCOPY (EGD) WITH PROPOFOL; N/A     Comment:  Procedure: ESOPHAGOGASTRODUODENOSCOPY (EGD) WITH               PROPOFOL;  Surgeon: Lucilla Lame, MD;  Location: [place];  Service: Endoscopy;  Laterality: N/A; 2008: HAND SURGERY; Left 10/08/2015: I&D EXTREMITY; Left     Comment:  Arm No date: TOOTH EXTRACTION BMI    Body Mass Index: 25.73 kg/m   ____Case cancelled 2 to pos amphetamine UDS, surgeon's office to reschedule___  Reproductive/Obstetrics                             Anesthesia Physical  Anesthesia Plan  ASA: III  Anesthesia Plan: General   Post-op Pain Management:    Induction: Intravenous  PONV Risk Score and Plan: 2 and Ondansetron, Midazolam, Dexamethasone and Treatment may vary due to age or medical condition  Airway Management Planned: Oral ETT  Additional Equipment:    Intra-op Plan:   Post-operative Plan: Extubation in OR  Informed Consent: I have reviewed the patients History and Physical, chart, labs and discussed the procedure including the risks, benefits and alternatives for the proposed anesthesia with the patient or authorized representative who has indicated his/her understanding and acceptance.     Dental Advisory Given  Plan Discussed with: CRNA  Anesthesia Plan Comments:         Anesthesia Quick Evaluation

## 2019-10-15 NOTE — Anesthesia Postprocedure Evaluation (Signed)
Anesthesia Post Note  Patient: Larry Parrish  Procedure(s) Performed: XI ROBOTIC ASSISTED INGUINAL HERNIA REPAIR WITH MESH (Bilateral Groin)  Patient location during evaluation: PACU Anesthesia Type: General Level of consciousness: awake and alert and oriented Pain management: pain level controlled Vital Signs Assessment: post-procedure vital signs reviewed and stable Respiratory status: spontaneous breathing Cardiovascular status: blood pressure returned to baseline Anesthetic complications: no     Last Vitals:  Vitals:   10/15/19 1505 10/15/19 1510  BP:  126/86  Pulse: 89 87  Resp: 12 12  Temp:    SpO2: 98% 95%    Last Pain:  Vitals:   10/15/19 1510  TempSrc:   PainSc: Asleep                 Kilie Rund

## 2019-10-15 NOTE — Interval H&P Note (Signed)
History and Physical Interval Note:  10/15/2019 10:50 AM  Larry Parrish  has presented today for surgery, with the diagnosis of K40.90 non recurrent inguinal hernia w/o obstruction or gangrene (possible bilateral).  The various methods of treatment have been discussed with the patient and family. After consideration of risks, benefits and other options for treatment, the patient has consented to  Procedure(s): XI ROBOTIC [place] (Right) (possible bilateral) as a surgical intervention.  The patient's history has been reviewed, patient examined, and he complained of discomfort on the left side, stable for surgery.  I have reviewed the patient's chart and labs. Right groin marked in the pre procedure room as the initial site of surgery but possible bilateral inguinal hernia repair if hernia found on the left groin.  Questions were answered to the patient's satisfaction.     Herbert Pun

## 2019-10-16 ENCOUNTER — Encounter: Payer: Self-pay | Admitting: General Surgery

## 2019-10-17 LAB — SURGICAL PATHOLOGY

## 2020-12-20 ENCOUNTER — Telehealth: Payer: Self-pay | Admitting: Gastroenterology

## 2020-12-20 NOTE — Telephone Encounter (Signed)
Error

## 2021-01-22 IMAGING — US VENOUS DOPPLER ULTRASOUND OF LEFT LOWER EXTREMITY
1 series · 14 of 24 positions shown · non-contrast
Comparison: 01/21/2011

CLINICAL DATA: Left lower extremity pain and swelling for 1 day

EXAM:
LEFT LOWER EXTREMITY VENOUS DUPLEX ULTRASOUND
TECHNIQUE: Doppler venous assessment of the left lower extremity deep venous
system was performed, including characterization of spectral flow,
compressibility, and phasicity.

[Series 1: venous doppler ultrasound of left lower extremity · 14 of 38 slices shown]
[im 1/38]
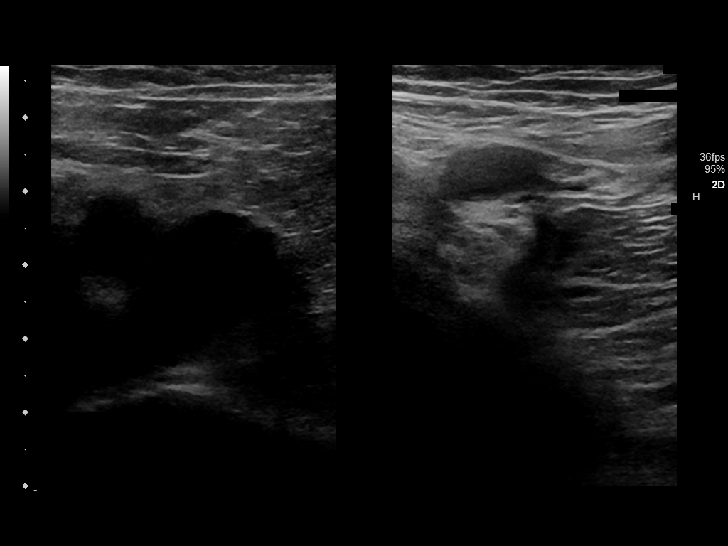
[im 4/38]
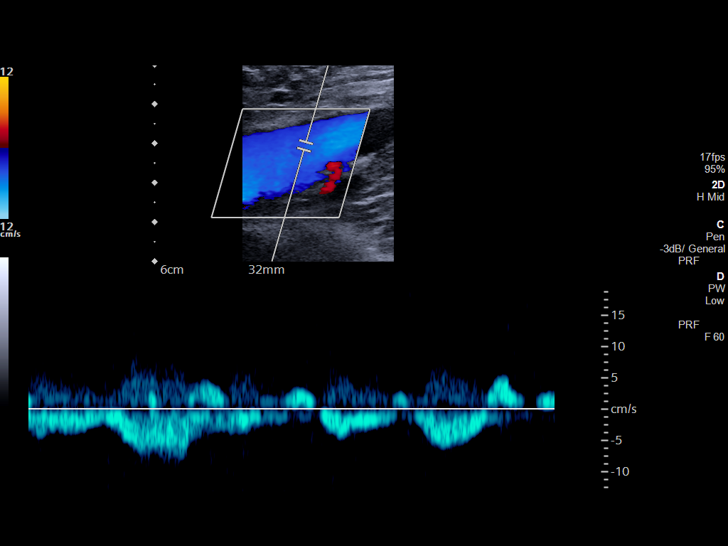
[im 7/38]
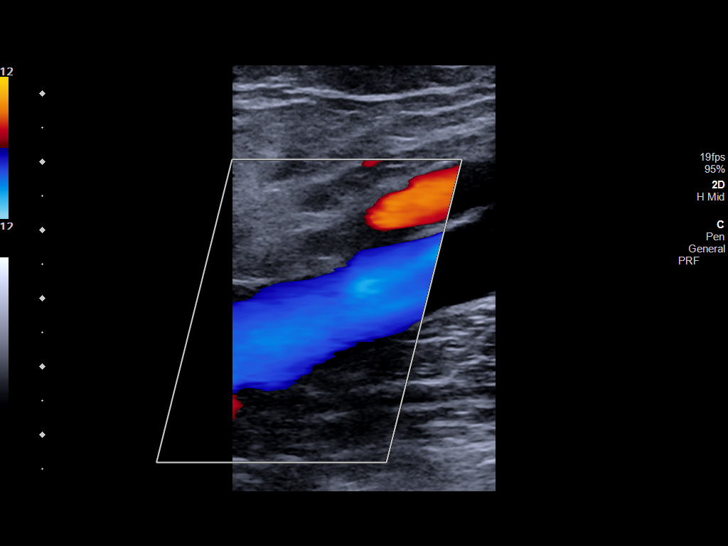
[im 10/38]
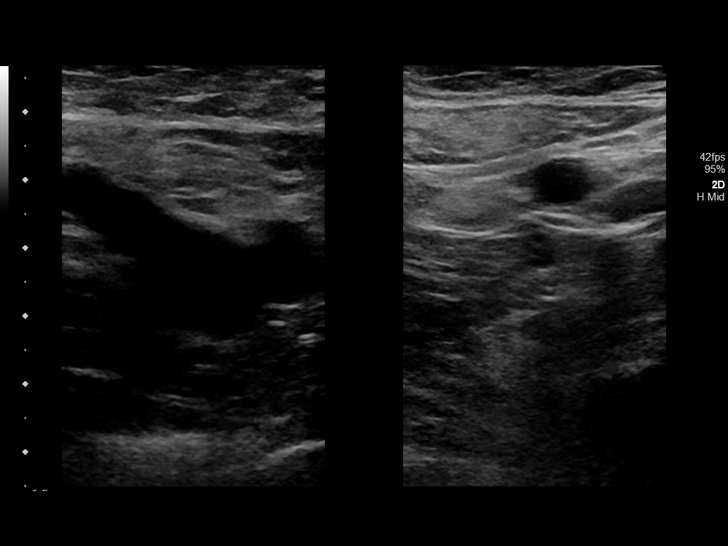
[im 12/38]
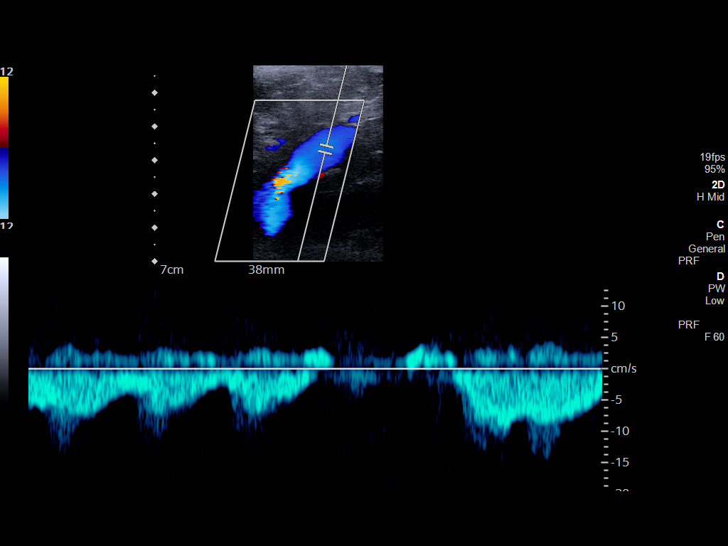
[im 15/38]
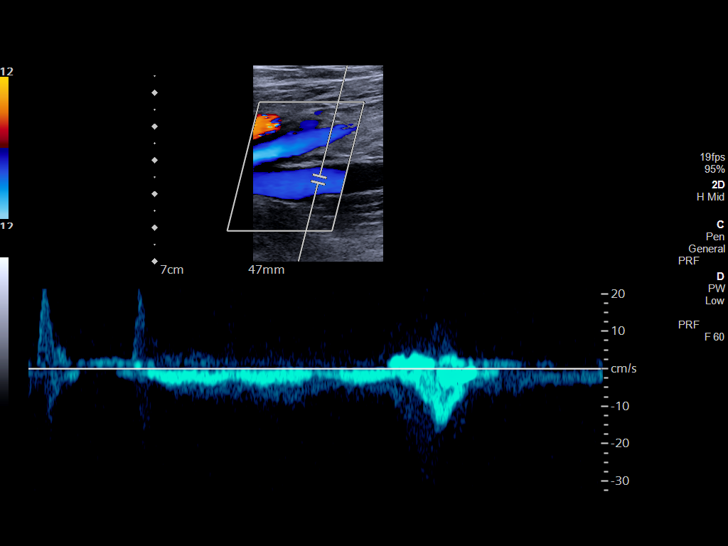
[im 18/38]
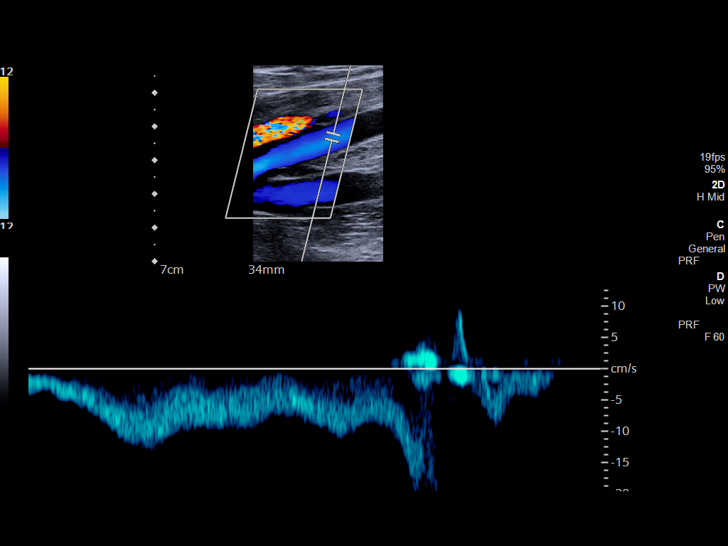
[im 20/38]
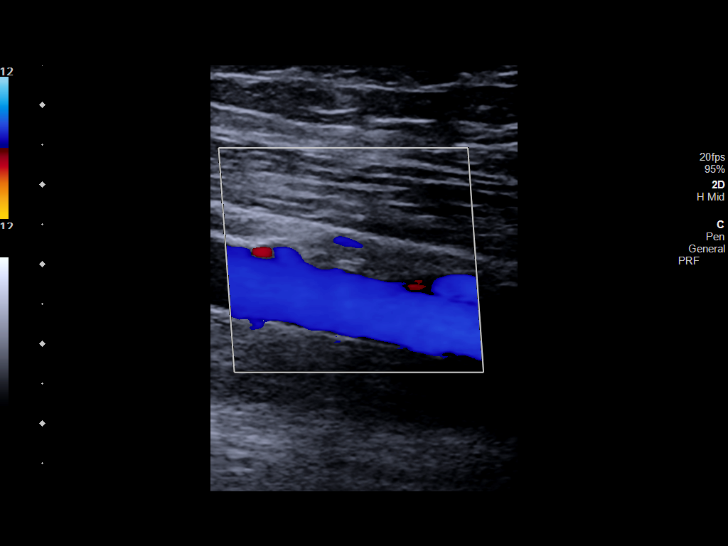
[im 23/38]
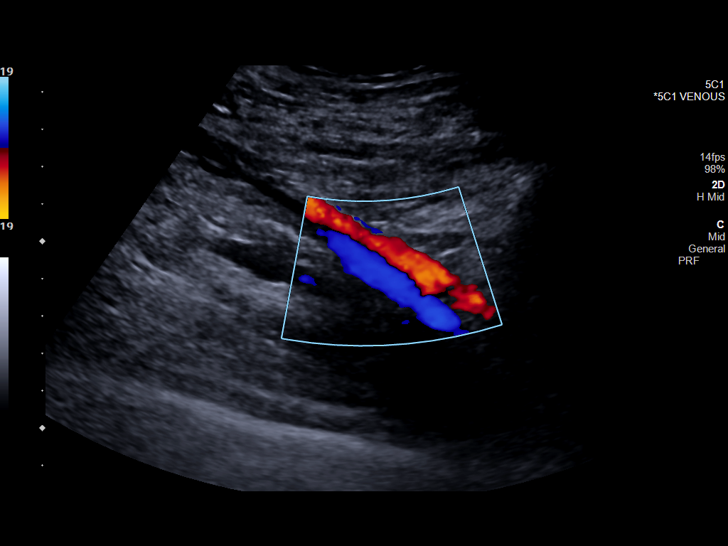
[im 26/38]
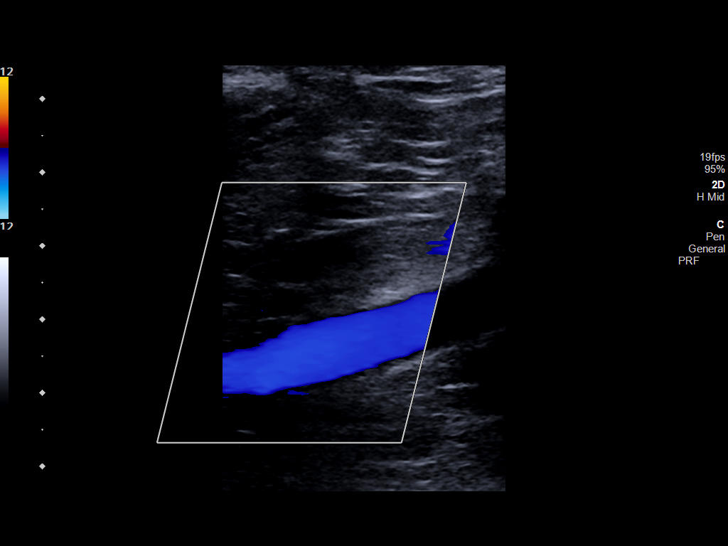
[im 29/38]
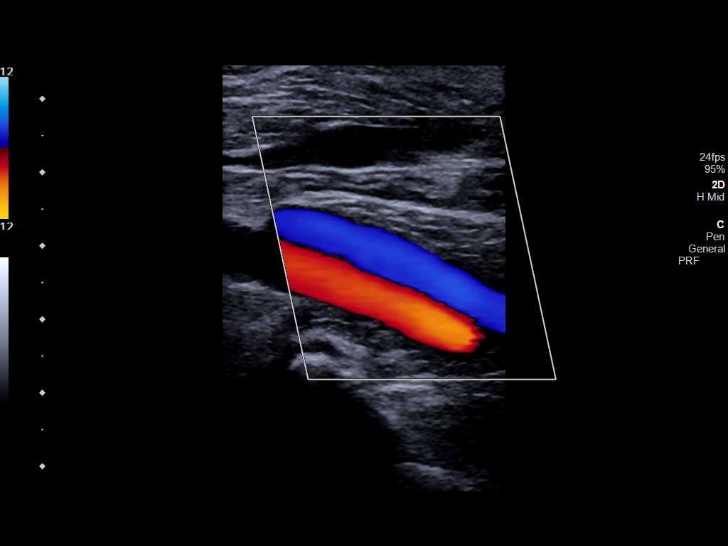
[im 31/38]
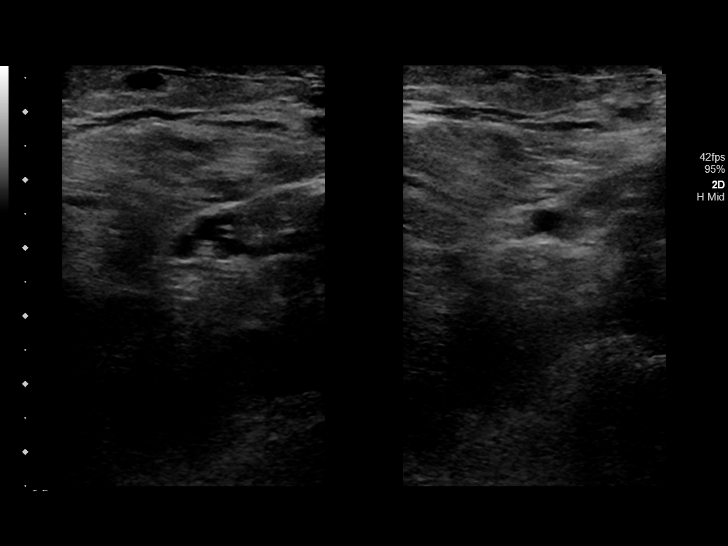
[im 34/38]
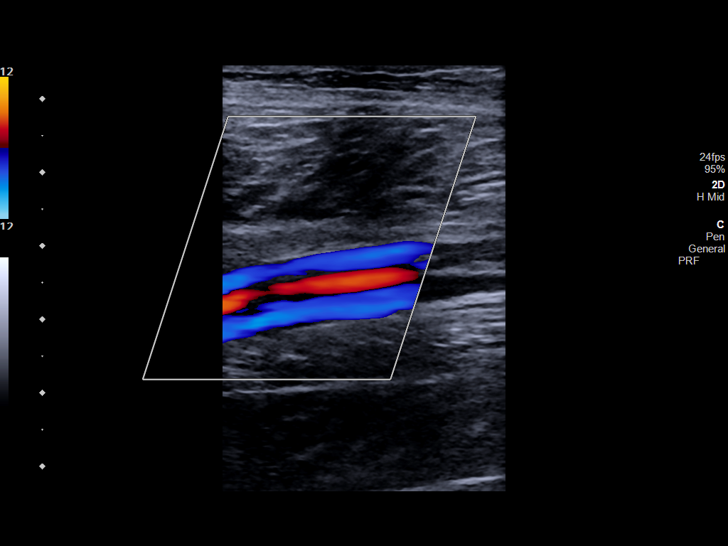
[im 38/38]
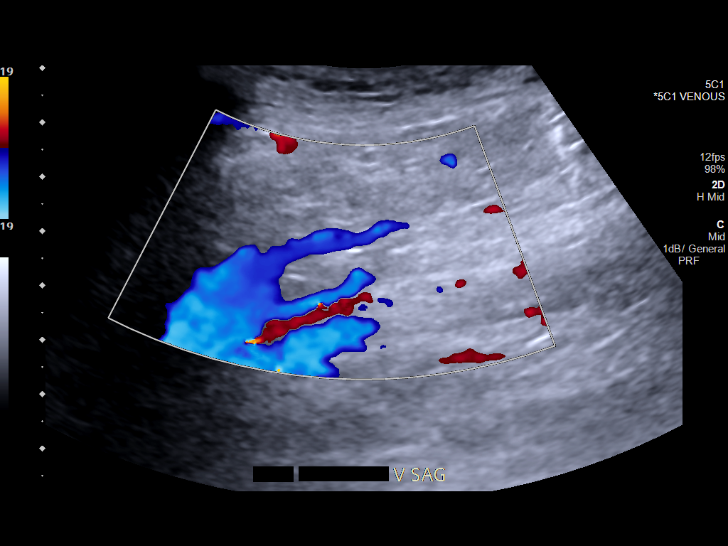

[14 of 24 positions shown; findings below may reference images not displayed]

FINDINGS: There is complete compressibility of the left common femoral,
femoral, and popliteal veins. Doppler analysis demonstrates
respiratory phasicity and augmentation of flow with calf
compression. No obvious superficial vein or calf vein thrombosis.
IMPRESSION: No evidence of left lower extremity DVT.

## 2021-05-24 ENCOUNTER — Telehealth: Payer: Self-pay | Admitting: Gastroenterology

## 2021-05-24 NOTE — Telephone Encounter (Signed)
Patient LVM for call back .  He did not leave any details as to why.
